# Patient Record
Sex: Female | Born: 1970 | Race: Black or African American | Hispanic: Yes | Marital: Single | Smoking: Never smoker
Health system: Southern US, Community
[De-identification: ages and names within clinical notes are randomized; demographics above are authoritative.]

## PROBLEM LIST (undated history)

## (undated) DIAGNOSIS — R51 Headache: Secondary | ICD-10-CM

## (undated) DIAGNOSIS — M1990 Unspecified osteoarthritis, unspecified site: Secondary | ICD-10-CM

## (undated) DIAGNOSIS — M25561 Pain in right knee: Secondary | ICD-10-CM

## (undated) DIAGNOSIS — F419 Anxiety disorder, unspecified: Secondary | ICD-10-CM

## (undated) DIAGNOSIS — R760 Raised antibody titer: Secondary | ICD-10-CM

## (undated) DIAGNOSIS — R519 Headache, unspecified: Secondary | ICD-10-CM

## (undated) DIAGNOSIS — D649 Anemia, unspecified: Secondary | ICD-10-CM

## (undated) DIAGNOSIS — I10 Essential (primary) hypertension: Secondary | ICD-10-CM

## (undated) DIAGNOSIS — M25562 Pain in left knee: Secondary | ICD-10-CM

## (undated) HISTORY — PX: MYOMECTOMY: SHX85

## (undated) HISTORY — PX: REDUCTION MAMMAPLASTY: SUR839

## (undated) HISTORY — PX: TUMOR REMOVAL: SHX12

## (undated) HISTORY — PX: WISDOM TOOTH EXTRACTION: SHX21

---

## 1999-04-09 ENCOUNTER — Other Ambulatory Visit: Admission: RE | Admit: 1999-04-09 | Discharge: 1999-04-09 | Payer: Self-pay | Admitting: Family Medicine

## 2000-09-25 ENCOUNTER — Encounter: Payer: Self-pay | Admitting: Family Medicine

## 2000-09-25 ENCOUNTER — Encounter: Admission: RE | Admit: 2000-09-25 | Discharge: 2000-09-25 | Payer: Self-pay | Admitting: Family Medicine

## 2000-11-25 ENCOUNTER — Encounter: Admission: RE | Admit: 2000-11-25 | Discharge: 2001-02-23 | Payer: Self-pay | Admitting: Obstetrics and Gynecology

## 2001-09-17 ENCOUNTER — Encounter: Payer: Self-pay | Admitting: Family Medicine

## 2001-09-17 ENCOUNTER — Encounter: Admission: RE | Admit: 2001-09-17 | Discharge: 2001-09-17 | Payer: Self-pay | Admitting: Family Medicine

## 2002-05-11 ENCOUNTER — Encounter (INDEPENDENT_AMBULATORY_CARE_PROVIDER_SITE_OTHER): Payer: Self-pay | Admitting: Specialist

## 2002-05-11 ENCOUNTER — Inpatient Hospital Stay (HOSPITAL_COMMUNITY): Admission: RE | Admit: 2002-05-11 | Discharge: 2002-05-15 | Payer: Self-pay | Admitting: Obstetrics and Gynecology

## 2002-05-13 ENCOUNTER — Encounter: Payer: Self-pay | Admitting: Obstetrics and Gynecology

## 2002-05-14 ENCOUNTER — Encounter: Payer: Self-pay | Admitting: Obstetrics and Gynecology

## 2002-10-13 ENCOUNTER — Other Ambulatory Visit: Admission: RE | Admit: 2002-10-13 | Discharge: 2002-10-13 | Payer: Self-pay | Admitting: Obstetrics and Gynecology

## 2003-03-15 ENCOUNTER — Ambulatory Visit (HOSPITAL_COMMUNITY): Admission: RE | Admit: 2003-03-15 | Discharge: 2003-03-15 | Payer: Self-pay | Admitting: Obstetrics and Gynecology

## 2003-10-19 ENCOUNTER — Other Ambulatory Visit: Admission: RE | Admit: 2003-10-19 | Discharge: 2003-10-19 | Payer: Self-pay | Admitting: Obstetrics and Gynecology

## 2004-11-28 ENCOUNTER — Other Ambulatory Visit: Admission: RE | Admit: 2004-11-28 | Discharge: 2004-11-28 | Payer: Self-pay | Admitting: Obstetrics and Gynecology

## 2004-12-07 ENCOUNTER — Ambulatory Visit: Payer: Self-pay | Admitting: Oncology

## 2006-12-01 ENCOUNTER — Encounter: Admission: RE | Admit: 2006-12-01 | Discharge: 2006-12-01 | Payer: Self-pay | Admitting: Otolaryngology

## 2010-09-07 NOTE — H&P (Signed)
NAME:  Claire Woods, Claire Woods                        ACCOUNT NO.:  [id number]   MEDICAL RECORD NO.:  [id number]                   PATIENT TYPE:  INP   LOCATION:  NA                                   FACILITY:  [facility]   PHYSICIAN:  Dois Davenport A. Rivard, M.D.              DATE OF BIRTH:  05-10-1970   DATE OF ADMISSION:  05/11/2002  DATE OF DISCHARGE:                                HISTORY & PHYSICAL   REASON FOR ADMISSION:  Uterine fibroids.   HISTORY OF PRESENT ILLNESS:  This is a 40 year old single African-American  woman, gravida 1, para 0, AB 1; who has been followed by our office since  July 2002 for symptomatic uterine fibroids, which have been know to the  patient since 1993.  She has used efficiently birth control pills from 1993  to 1997, which did control her symptoms.  Her main concern at this point is  the size of her uterine fibroids, which is now very uncomfortable.   An ultrasound in May 2003 revealed an enlarged uterus, measuring 2.6 x 11.2  x 20 cm, with the largest fibroid reaching the left lobe of her liver and  measuring 14.7 x 11.4 x 14.4 cm.  At that time, in June 2003, she had  stopped using Alesse and she was reporting a regular menstrual cycle every  28 days, lasting for five to six days with 48-72 hrs of very heavy flow,  where she required the use of one tampon and one pad every 2-3 hours.  Her  dysmenorrhea lasted two to three days with back pain, worse at night; for  which she uses ibuprofen 600 mg once or twice a day, with no significant  improvement when lying down.   She has now received Depo-Lupron 11.25 mg on October 28, 2001, 3.75 mg on  October 6, November 4, March 25 2002 and April 26, 2002.  Her last  ultrasound in the office was April 12, 2002, which revealed a uterine  size of 17 cm x 10.3 cm x 17.3 cm with a large fundal fibroid measuring  11.4 x 8.4 x 9.5 cm.  A second fibroid in the anterior middle part of the  uterus measured  5.5 x 5.7 x 5.5 cm.   The ovaries were not well seen on that ultrasound, but  no mass was seen in the adnexal area.  Previous ultrasound had seen right  ovary, which was normal; left ovary was never seen, due to the shadowing of  the uterine fibroid.  She had been amenorrheic since August 2003, and she  now desires surgery to maintain her fertility with myomectomy.   An MRI of the pelvis dated October 12, 2001 revealed absence of hydronephrosis,  markedly enlarged uterus, overall size 15 x 10 x 20; with a large fundal  fibroid measuring 10 x 15 x 13, multiple smaller fibroids are seen in the  body and lower uterine  segments and are ranging from 3-6 cm.  Normal  endometrium.   REVIEW OF SYSTEMS:  CONSTITUTIONAL:  Negative.  HEENT:  Normal.  HEART:  Normal.  RESPIRATORY:  Normal.  GENITOURINARY:  Other than previously  mentioned, normal.  GASTROINTESTINAL:  Normal.  NEUROLOGICAL:  Normal.   PAST MEDICAL HISTORY:  Hypertension, currently using Accuretic 20/25 mg q.d.   PAST SURGICAL HISTORY:  No previous surgeries.   ALLERGIES:  NO KNOWN DRUG ALLERGIES.   SOCIAL HISTORY:  Single, nonsmoker.  She is a Psychologist, forensic.   FAMILY HISTORY:  No history of feminine or colon cancer.  Mother is 49-year-  old and has hypertension.  Father is  4 years old and has hypertension.  Two sisters, one 71 year old with  possible Lupus and a  38 year old alive and well; one brother, 43 years old, who is alive and  well.   PHYSICAL EXAMINATION:  VITAL SIGNS:  Current weight 243 pounds.  Height 5  feet 7 inches.  Blood pressure 130/90.  HEENT:  Normal.  NECK:  Thyroid normal.  HEART:  Normal.  CHEST:  Clear.  BREASTS:  Normal.  BACK:  Normal; no CVA tenderness.  ABDOMEN:  No tenderness or hepatosplenomegaly; uterine fibroid is felt  feeling the pelvic abdominal cavity.  EXTREMITIES:  Normal.  NEUROLOGIC:  Normal.  GYN EXAM:  Reveals a normal external genitalia.  Vagina is normal.  Cervix  is normal.  Uterus is  enlarged, again rising to the upper left quadrant of  the abdomen.   ASSESSMENT:  Large uterine fibroids in a patient desiring preservation of  fertility.   PLAN:  The patient will undergo myomectomy with laparotomy.   The procedure has been reviewed in depth with the patient, including  possible complications such as bleeding requiring transfusion, infection;  injury to bowels, bladder or ureters, need for hysterectomy, possibility of  tubal occlusion after the procedure.  Informed consent was obtained.                                               Crist Fat Rivard, M.D.    SAR/MEDQ  D:  05/10/2002  T:  05/10/2002  Job:  [id number]

## 2010-09-07 NOTE — Discharge Summary (Signed)
NAME:  Claire Woods, Claire Woods                        ACCOUNT NO.:  [id number]   MEDICAL RECORD NO.:  [id number]                   PATIENT TYPE:  INP   LOCATION:  9304                                 FACILITY:  [facility]   PHYSICIAN:  Crist Fat. Rivard, M.D.              DATE OF BIRTH:  12/25/1970   DATE OF ADMISSION:  05/11/2002  DATE OF DISCHARGE:  05/15/2002                                 DISCHARGE SUMMARY   DISCHARGE DIAGNOSES:  1. Large uterine fibroids.  2. Desire to preserve fertility.  3. Severe anemia.   OPERATION AND PROCEDURES:  On the date of admission the patient underwent an  abdominal myomectomy with placement of a JP drain, tolerating the procedure  well.  The patient was found to have multiple fibroids on her uterus for a  total of 13, with the largest measuring 16 cm x 10 cm.  The total weight of  all of the fibroids was 1326 grams.  The patient had normal-appearing tubes  and ovaries bilaterally.  The patient received a blood transfusion of 2  units of packed red blood cells on May 13, 2002.  She had a chest x-ray  which revealed low lung volumes and mild bilateral lower lung atelectasis on  May 13, 2002.  She also had a pelvic ultrasound on May 14, 2002  which was technically limited due to enlarged uterus but showed a complex  fluid collection in the left adnexa with extension to the cul-de-sac  measuring approximately 5.9 x 2.0 x 4.5 cm.   HISTORY OF PRESENT ILLNESS:  The patient is a 40 year old single African-  American female para 1-0-1-1 who underwent an abdominal myomectomy because  of symptomatic uterine fibroids.  Please see the patient's dictated History  and Physical Examination for details.   PHYSICAL EXAMINATION:  VITAL SIGNS:  Weight 243 pounds, height 5 feet 7  inches tall, blood pressure 130/90.  GENERAL:  Within normal limits; however, note that abdomen is nontender  without hepatosplenomegaly though there is a uterine fibroid felt arising  from the pelvic abdominal cavity to the level of the patient's liver edge.  GYNECOLOGICAL:  Normal external genitalia.  Vagina is normal.  Cervix is  normal.  Uterus is enlarged, again, to the upper quadrant of the abdomen.   HOSPITAL COURSE:  On the date of admission the patient underwent  aforementioned procedure, tolerating it well.  Postoperative course was  marked by severe anemia - immediately postoperative 5.9 hemoglobin compared  with a preoperative hemoglobin of 12.  The patient also spiked a temperature  as high as 102.8 for which she was placed on clindamycin, Unasyn, and  ampicillin.  Due to severe symptoms related to her anemia, the patient  consented on postoperative day #1 to receive 2 units of packed red blood  cells which brought her hemoglobin to a level of 6.7.  By postoperative day  #3 the patient had resumed bowel  and bladder function and had shown a  resolution of her febrile state.  By postoperative day #4 she had achieved  the maximum benefit of her hospital stay and therefore was deemed ready for  discharge home.   DISCHARGE MEDICATIONS:  1. Iron 325 mg one tablet twice daily for six weeks.  2. Phenergan 12.5 mg one tablet q.6h. as needed for nausea.  3. Ibuprofen 600 mg one tablet with food q.6h. for five days then as needed     for pain.  4. Tylox one to two tablets q.4-6h. as needed for pain.  5. Colace one tablet twice daily until bowel movements are regular.  6. The patient was also given antibiotics by Dr. Dois Davenport Rivard, Augmentin     and clindamycin, to take for 10 days.   FOLLOW-UP:  The patient is scheduled for a staple removal at [facility] on May 20, 2002 at 11:45 a.m.  She also is scheduled for  a two weeks follow-up with Dr. Estanislado Pandy on May 28, 2002 at 9 a.m.  The  patient has a six weeks postoperative visit with  Dr. Estanislado Pandy on June 25, 2002 at 3 p.m.   DISCHARGE INSTRUCTIONS:  1. The patient was given a copy of [facility] postoperative     instruction sheet.  2. She was further advised to avoid driving for two weeks, heavy lifting for     four weeks, and intercourse for six weeks.  3. The patient's diet is without restriction.   FINAL PATHOLOGY:  Uterus, myomectomy:  Leiomyomata.     [place] J. Adline Peals.                    Crist Fat Rivard, M.D.    EJP/MEDQ  D:  06/17/2002  T:  06/18/2002  Job:  [id number]

## 2010-09-07 NOTE — Op Note (Signed)
NAME:  Claire Woods, Claire Woods                        ACCOUNT NO.:  [id number]   MEDICAL RECORD NO.:  [id number]                   PATIENT TYPE:  INP   LOCATION:  9304                                 FACILITY:  [facility]   PHYSICIAN:  Crist Fat. Rivard, M.D.              DATE OF BIRTH:  [birth year]   DATE OF PROCEDURE:  05/11/2002  DATE OF DISCHARGE:                                 OPERATIVE REPORT   PREOPERATIVE DIAGNOSIS:  Uterine fibroids.   POSTOPERATIVE DIAGNOSIS:  Uterine fibroids.   PROCEDURE:  Myomectomy.   SURGEON:  Crist Fat. Rivard, M.D.   ASSISTANTMarquis Lunch. Adline Peals.   ANESTHESIA:  General.   ESTIMATED BLOOD LOSS:  1000 cubic centimeters.   DESCRIPTION OF PROCEDURE:  After being informed of the planned procedure  with possible complications, including bleeding, need for transfusion,  infection, injury to other organs, risk of requiring hysterectomy, risk of  postoperative tubal occlusion, informed consent was obtained.  The patient  was taken to OR #3, given general anesthesia with endotracheal intubation  without complication.   The patient was placed in a dorsal decubitus position, prepped and draped in  a sterile fashion, and a Foley catheter was inserted in her bladder.  After  flexing her hips and knees, we inserted a speculum in order to place an  intrauterine balloon catheter for chromopertubation during the procedure.  Due to the size of the fibroids, the cervical os was impossible to  cannulate.  We then proceeded with a midline incision from symphysis pubis  to umbilical area using knife down to the fascia.  The fascia is incised in  a midline fashion, linea alba is identified, and peritoneum is entered in a  blunt fashion.   Observation:  The uterus is greatly enlarged with a predominant fundal  fibroid measuring approximately 16 cm in size.  We then see in the right  cornual area two intramural fibroids measuring 4 cm, three subserosal  fibroids measuring  1 cm.  In the left cornual posterior area, two fibroids  measuring 4-5 cm, intramural, three fibroids measuring 1-2 cm, intramural.  Both tubes are visualized and normal.  Both ovaries are normal.  The right  tube is displaced anteriorly by one of the previously-mentioned intramural  fibroids.  We are able to exteriorize the uterus en bloc, and the broad  ligament is opened on each side of the lower uterine segment in an avascular  space using cautery.  These spaces allow [place] to place three tourniquets using  5 French feeding tubes encircling the lower uterine segment and both  infundibulopelvic ligaments in order to decrease the pulse pressure.  These  tourniquets are kept in place using Kelly clamps.  We then proceed with  systematic removal of all fibroids, starting with the large fundal fibroid  with a midline fundal incision after infiltration with vasopressin.  The  fibroid is enucleated and removed entirely.  Through the same incision we  are able to remove most of the previously-mentioned fibroids.  The  myometrium is then closed in multiple layers using 0 Vicryl sutures.  The  serosa is then closed using a baseball running suture of 2-0 Vicryl.  The  incision is closed transversely from one cornu to the other, taking care in  avoiding the tubal ostia.  We then remove through a posterior wall midline  incision five of the previously-mentioned fibroids, again after infiltration  with vasopressin.  Again the myometrium is closed in multiple layers until  complete reapproximation using simple sutures of 0 Vicryl and closing the  serosa with a running baseball suture of 2-0 Vicryl.  We are able to remove  all the fibroids noted in the myometrium.  At no time is there any entrance  in the endometrial cavity.  Some concern exists in regard to the right tube  due to its anatomical position in regard to the fibroids removed.  The left  tube was never affected by the removal of the other  fibroids.  We then  evaluate hemostasis, which is felt to be adequate.  We irrigate the pelvis  with warm saline.  We remove all tourniquets, return the uterus in the  pelvic cavity, and cover both incisions with one sheet of Interceed.  Under-  fascia hemostasis is completed with cautery, and the fascia is closed with  two running sutures of 0 Vicryl  meeting midline.  Subcutaneous tissue and  fat were irrigated with warm saline, and their hemostasis was completed with  cautery.  Due to the thickness of the panniculus, a Jackson-Pratt drain was  left in the incision and exteriorized via a contra-incision in the left  lower quadrant and sutured in place with 0 nylon.  Skin was closed with  staples.  Instrument and sponge count were complete x2.  Estimated blood  loss is at 1000 cubic centimeters.  The patient received 5000 cubic  centimeters of crystalloids and had a urinary output of 750 cubic  centimeters over the three-hour duration of the procedure.  The procedure  was very well-tolerated by the patient, who was taken to the recovery room  in the well and stable condition.  Please note that 13 fibroids were removed  altogether, for a total weight of 1300 g, or 2 pounds 14 ounces.                                                Crist Fat Rivard, M.D.    SAR/MEDQ  D:  05/11/2002  T:  05/12/2002  Job:  [id number]

## 2011-03-26 ENCOUNTER — Other Ambulatory Visit: Payer: Self-pay | Admitting: Obstetrics and Gynecology

## 2011-03-26 ENCOUNTER — Other Ambulatory Visit (HOSPITAL_COMMUNITY)
Admission: RE | Admit: 2011-03-26 | Discharge: 2011-03-26 | Disposition: A | Discharge status: Home / Self Care | Payer: BC Managed Care – PPO | Source: Ambulatory Visit | Attending: Obstetrics and Gynecology | Admitting: Obstetrics and Gynecology

## 2011-03-26 DIAGNOSIS — Z01419 Encounter for gynecological examination (general) (routine) without abnormal findings: Secondary | ICD-10-CM | POA: Insufficient documentation

## 2013-02-12 ENCOUNTER — Other Ambulatory Visit (HOSPITAL_COMMUNITY)
Admission: RE | Admit: 2013-02-12 | Discharge: 2013-02-12 | Disposition: A | Discharge status: Home / Self Care | Payer: BC Managed Care – PPO | Source: Ambulatory Visit | Attending: Obstetrics and Gynecology | Admitting: Obstetrics and Gynecology

## 2013-02-12 ENCOUNTER — Other Ambulatory Visit: Payer: Self-pay | Admitting: Obstetrics and Gynecology

## 2013-02-12 DIAGNOSIS — Z01419 Encounter for gynecological examination (general) (routine) without abnormal findings: Secondary | ICD-10-CM | POA: Insufficient documentation

## 2013-02-12 DIAGNOSIS — Z1151 Encounter for screening for human papillomavirus (HPV): Secondary | ICD-10-CM | POA: Insufficient documentation

## 2013-03-10 ENCOUNTER — Other Ambulatory Visit: Payer: Self-pay

## 2013-03-10 DIAGNOSIS — Z1231 Encounter for screening mammogram for malignant neoplasm of breast: Secondary | ICD-10-CM

## 2013-04-07 ENCOUNTER — Ambulatory Visit: Payer: BC Managed Care – PPO

## 2014-07-22 ENCOUNTER — Other Ambulatory Visit: Payer: Self-pay

## 2014-07-22 DIAGNOSIS — Z1231 Encounter for screening mammogram for malignant neoplasm of breast: Secondary | ICD-10-CM

## 2014-08-01 ENCOUNTER — Ambulatory Visit
Admission: RE | Admit: 2014-08-01 | Discharge: 2014-08-01 | Disposition: A | Discharge status: Home / Self Care | Payer: BC Managed Care – PPO | Source: Ambulatory Visit

## 2014-08-01 DIAGNOSIS — Z1231 Encounter for screening mammogram for malignant neoplasm of breast: Secondary | ICD-10-CM

## 2014-08-22 ENCOUNTER — Other Ambulatory Visit: Payer: Self-pay | Admitting: Obstetrics and Gynecology

## 2014-08-22 ENCOUNTER — Other Ambulatory Visit (HOSPITAL_COMMUNITY)
Admission: RE | Admit: 2014-08-22 | Discharge: 2014-08-22 | Disposition: A | Discharge status: Home / Self Care | Payer: BC Managed Care – PPO | Source: Ambulatory Visit | Attending: Obstetrics and Gynecology | Admitting: Obstetrics and Gynecology

## 2014-08-22 DIAGNOSIS — Z01419 Encounter for gynecological examination (general) (routine) without abnormal findings: Secondary | ICD-10-CM | POA: Insufficient documentation

## 2014-08-23 LAB — CYTOLOGY - PAP

## 2014-10-07 ENCOUNTER — Ambulatory Visit: Payer: BC Managed Care – PPO | Admitting: Podiatry

## 2014-11-04 ENCOUNTER — Ambulatory Visit (INDEPENDENT_AMBULATORY_CARE_PROVIDER_SITE_OTHER): Payer: BC Managed Care – PPO

## 2014-11-04 ENCOUNTER — Ambulatory Visit (INDEPENDENT_AMBULATORY_CARE_PROVIDER_SITE_OTHER): Payer: BC Managed Care – PPO | Admitting: Podiatry

## 2014-11-04 DIAGNOSIS — M7662 Achilles tendinitis, left leg: Secondary | ICD-10-CM | POA: Diagnosis not present

## 2014-11-04 DIAGNOSIS — M79672 Pain in left foot: Secondary | ICD-10-CM

## 2014-11-04 MED ORDER — DICLOFENAC SODIUM 75 MG PO TBEC: 75.0000 mg | DELAYED_RELEASE_TABLET | Freq: Two times a day (BID) | ORAL | Status: DC

## 2014-11-04 NOTE — Progress Notes (Signed)
Subjective:     Patient ID: Claire Woods, female   DOB: Sep 05, 1970, 44 y.o.   MRN: [id number]  HPI patient presents stating my left Achilles tendon has been killing me and it's been going on for a month and I don't know what I did but it seems to be getting worse   Review of Systems  All other systems reviewed and are negative.      Objective:   Physical Exam  Constitutional: She is oriented to person, place, and time.  Cardiovascular: Intact distal pulses.   Musculoskeletal: Normal range of motion.  Neurological: She is oriented to person, place, and time.  Skin: Skin is warm.  Nursing note and vitals reviewed.  neurovascular status intact with muscle strength adequate range of motion within normal limits. Patient's noted to have some tightness of the Achilles tendon left but muscle strength was adequate of the tendon itself and does have quite a bit of inflammation on the medial side at the insertion to the calcaneus and more proximal at the musculotendinous junction. Patient has good digital perfusion and is well oriented 3     Assessment:     Acute Achilles tendinitis left with possible pre-rupture like environment    Plan:     H&P x-rays reviewed and educated patient on this problem. I have recommended immobilization due to where the problem is and I applied air fracture walker with instructions on usage along with ice therapy and oral diclofenac therapy. Patient will be reviewed evaluated in 4 weeks and may require physical therapy or MRI if symptoms were to persist

## 2014-11-04 NOTE — Progress Notes (Signed)
   Subjective:    Patient ID: Claire Woods, female    DOB: 16-Feb-1971, 44 y.o.   MRN: [id number]  HPI Pt presents with pain in left achilles tendon area, radiating medial and lateral, lasting 1 month now and worsening, unable to get pain relief. Pain is present when ambulating and when resting   Review of Systems  All other systems reviewed and are negative.      Objective:   Physical Exam        Assessment & Plan:

## 2014-11-04 NOTE — Patient Instructions (Signed)

## 2014-12-01 ENCOUNTER — Encounter: Payer: Self-pay | Admitting: Podiatry

## 2014-12-01 ENCOUNTER — Ambulatory Visit (INDEPENDENT_AMBULATORY_CARE_PROVIDER_SITE_OTHER): Payer: BC Managed Care – PPO | Admitting: Podiatry

## 2014-12-01 VITALS — BP 139/95 | HR 83 | Resp 16

## 2014-12-01 DIAGNOSIS — M7662 Achilles tendinitis, left leg: Secondary | ICD-10-CM

## 2014-12-01 MED ORDER — MELOXICAM 15 MG PO TABS
15.0000 mg | ORAL_TABLET | Freq: Every day | ORAL | Status: DC
Start: 2014-12-01 — End: 2015-03-02

## 2014-12-01 NOTE — Progress Notes (Signed)
Subjective:     Patient ID: Claire Woods, female   DOB: 24-Mar-1971, 44 y.o.   MRN: [id number]  HPI patient states it does feel a little bit better but still sore in the back of my left heel when I been on it a lot   Review of Systems     Objective:   Physical Exam neurovascular status intact   with continued discomfort in the Achilles tendon left in the posterior aspect at the musculotendinous junction. Insertion is doing well at this time   Assessment:     Achilles tendinitis improving but still present left    Plan:     Advised on continued immobilization gradual increase in activities and stretching heat and ice therapy and he'll lift. Reappoint to reevaluate again in 1 month

## 2015-01-05 ENCOUNTER — Ambulatory Visit: Payer: BC Managed Care – PPO | Admitting: Podiatry

## 2015-01-06 ENCOUNTER — Telehealth: Payer: Self-pay | Admitting: Podiatry

## 2015-01-06 NOTE — Telephone Encounter (Signed)
Left voicemail to call office to r/s appt.

## 2015-03-02 ENCOUNTER — Other Ambulatory Visit: Payer: Self-pay | Admitting: Podiatry

## 2015-03-02 NOTE — Telephone Encounter (Signed)
Pt was instructed to make a follow up appt, no future refills until evaluated.

## 2015-04-21 ENCOUNTER — Other Ambulatory Visit: Payer: Self-pay | Admitting: Podiatry

## 2015-05-27 ENCOUNTER — Other Ambulatory Visit: Payer: Self-pay | Admitting: Podiatry

## 2015-05-29 NOTE — Telephone Encounter (Signed)
Pt needs an appt prior to refills. 

## 2015-06-28 ENCOUNTER — Other Ambulatory Visit: Payer: Self-pay | Admitting: Podiatry

## 2015-07-05 ENCOUNTER — Ambulatory Visit: Payer: BC Managed Care – PPO | Admitting: Podiatry

## 2015-07-10 ENCOUNTER — Ambulatory Visit: Payer: BC Managed Care – PPO | Admitting: Podiatry

## 2015-07-12 ENCOUNTER — Encounter: Payer: Self-pay | Admitting: Podiatry

## 2015-07-12 ENCOUNTER — Ambulatory Visit (INDEPENDENT_AMBULATORY_CARE_PROVIDER_SITE_OTHER): Payer: BC Managed Care – PPO | Admitting: Podiatry

## 2015-07-12 VITALS — BP 151/96 | HR 95 | Resp 16

## 2015-07-12 DIAGNOSIS — M7662 Achilles tendinitis, left leg: Secondary | ICD-10-CM

## 2015-07-12 DIAGNOSIS — M79672 Pain in left foot: Secondary | ICD-10-CM

## 2015-07-12 MED ORDER — TRIAMCINOLONE ACETONIDE 10 MG/ML IJ SUSP
10.0000 mg | Freq: Once | INTRAMUSCULAR | Status: AC
  Administered 2015-07-12: 10 mg

## 2015-07-13 NOTE — Progress Notes (Signed)
Subjective:     Patient ID: Claire Woods, female   DOB: 1971-02-10, 45 y.o.   MRN: [id number]  HPI patient states I have this spot on the back of my heel that's become really sore. It's now where it inserts into the heel bone and it doesn't seem do with the tendon itself but it makes it hard to walk comfortably   Review of Systems     Objective:   Physical Exam Neurovascular status intact with discomfort at the insertion of the Achilles tendon into the posterior heel lateral side with inflammation noted with no central or medial tendon involvement    Assessment:     Achilles tendinitis left lateral side    Plan:     Instructed on physical therapy and went ahead and did careful injection lateral side 3 mg dexamethasone Kenalog 5 mg Xylocaine and advised on taking it easy and before doing the procedure I did discuss risk associated with this including chances for rupture. Patient understands risk and allow procedure to be performed

## 2015-07-20 ENCOUNTER — Other Ambulatory Visit: Payer: Self-pay | Admitting: Podiatry

## 2015-08-09 ENCOUNTER — Ambulatory Visit: Payer: BC Managed Care – PPO | Admitting: Podiatry

## 2015-08-26 ENCOUNTER — Other Ambulatory Visit: Payer: Self-pay | Admitting: Podiatry

## 2015-08-28 NOTE — Telephone Encounter (Signed)
Pt needs an appt prior to future refills. 

## 2015-11-27 ENCOUNTER — Ambulatory Visit (INDEPENDENT_AMBULATORY_CARE_PROVIDER_SITE_OTHER): Payer: BC Managed Care – PPO | Admitting: Podiatry

## 2015-11-27 ENCOUNTER — Encounter: Payer: Self-pay | Admitting: Podiatry

## 2015-11-27 ENCOUNTER — Ambulatory Visit (INDEPENDENT_AMBULATORY_CARE_PROVIDER_SITE_OTHER): Payer: BC Managed Care – PPO

## 2015-11-27 VITALS — BP 137/100 | HR 95 | Resp 16

## 2015-11-27 DIAGNOSIS — M25572 Pain in left ankle and joints of left foot: Secondary | ICD-10-CM

## 2015-11-27 DIAGNOSIS — M7662 Achilles tendinitis, left leg: Secondary | ICD-10-CM

## 2015-11-27 MED ORDER — TRIAMCINOLONE ACETONIDE 10 MG/ML IJ SUSP
10.0000 mg | Freq: Once | INTRAMUSCULAR | Status: AC
  Administered 2015-11-27: 10 mg

## 2015-11-28 NOTE — Progress Notes (Signed)
Subjective:     Patient ID: Claire Woods, female   DOB: November 16, 1970, 45 y.o.   MRN: [id number]  HPI patient states that her ankle did really well for around 6 months and is started to hurt her again and she wants to see if there's anything we can do to help her short-term   Review of Systems     Objective:   Physical Exam Neurovascular status intact with discomfort in the posterior lateral aspect of the left heel with inflammation with the center and medial doing well with good movement of the tendon and no indication of dysfunction    Assessment:     Achilles tendinitis left reoccurring    Plan:     Reviewed condition and advised on careful injection discussing the chances for rupture associated with it. Patient wants the procedure and today I went ahead did careful lateral injection 3 mg dexamethasone Kenalog 5 mg Xylocaine advised on ice therapy utilization of cast immobilization and will reduce activities. Reappoint to recheck again in 3 weeks

## 2015-12-14 ENCOUNTER — Other Ambulatory Visit: Payer: Self-pay | Admitting: Physician Assistant

## 2015-12-14 ENCOUNTER — Ambulatory Visit
Admission: RE | Admit: 2015-12-14 | Discharge: 2015-12-14 | Disposition: A | Discharge status: Home / Self Care | Payer: BC Managed Care – PPO | Source: Ambulatory Visit | Attending: Physician Assistant | Admitting: Physician Assistant

## 2015-12-14 DIAGNOSIS — R52 Pain, unspecified: Secondary | ICD-10-CM

## 2015-12-28 ENCOUNTER — Ambulatory Visit: Payer: BC Managed Care – PPO | Admitting: Podiatry

## 2016-01-04 ENCOUNTER — Other Ambulatory Visit: Payer: Self-pay | Admitting: Obstetrics and Gynecology

## 2016-01-04 ENCOUNTER — Other Ambulatory Visit (HOSPITAL_COMMUNITY)
Admission: RE | Admit: 2016-01-04 | Discharge: 2016-01-04 | Disposition: A | Discharge status: Home / Self Care | Payer: BC Managed Care – PPO | Source: Ambulatory Visit | Attending: Obstetrics and Gynecology | Admitting: Obstetrics and Gynecology

## 2016-01-04 DIAGNOSIS — Z01419 Encounter for gynecological examination (general) (routine) without abnormal findings: Secondary | ICD-10-CM | POA: Diagnosis not present

## 2016-01-05 LAB — CYTOLOGY - PAP

## 2016-01-23 ENCOUNTER — Encounter: Payer: Self-pay | Admitting: Oncology

## 2016-01-23 ENCOUNTER — Ambulatory Visit (INDEPENDENT_AMBULATORY_CARE_PROVIDER_SITE_OTHER): Payer: BC Managed Care – PPO | Admitting: Oncology

## 2016-01-23 DIAGNOSIS — Z8269 Family history of other diseases of the musculoskeletal system and connective tissue: Secondary | ICD-10-CM | POA: Diagnosis not present

## 2016-01-23 DIAGNOSIS — R760 Raised antibody titer: Secondary | ICD-10-CM

## 2016-01-23 NOTE — Consult Note (Signed)
New Patient Hematology   Claire Woods [id number] 1970-05-28 45 y.o. 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 She was first pregnant at age 89 and had a miscarriage in less than 4 weeks. She was pregnant again at age 58 and had a miscarriage prior to 8 weeks. She had surgery for a fibroid uterus about 10 years ago and had significant bleeding requiring transfusion. She has never had a thrombotic event. About one year ago she started to develop pain in her hand joints and subsequently her knees left greater than right. Joints are stiff in the morning. She has had problems with atypical rashes on her scalp, face, and back of the neck. She has had patchy alopecia. She gets intermittent headaches. She has cold intolerance and notices cyanotic changes of her fingertips in the cold. She was never told that she had a pleural or pericardial effusion. Thyroid functions have been normal.  She lost a sister at age 56 of complications of lupus and GI bleeding. A paternal aunt had lupus.  Laboratory evaluation done on 02/28/2015: ANA +1:320 speckled pattern. Anti-RNP antibodies elevated 3.0 normal less than 0.9, no elevation of Sjogren's, Smith, or anti-double-stranded DNA antibodies. C4 complement decreased 9 mg percent. Total complement greater than 60. C3 complement normal 1:15. C reactive protein 4.8 upper normal 4.9. IgM against beta-2 glycoprotein 1 148 units upper normal 32, against IgG 9 units (0-20). Lupus anticoagulant positive: PTT-L a 51.8 seconds, mix 49.6 seconds, DR VVT 80.5 seconds, mix 77.1, DR VVT confirm ratio 2.1 normal 0.8-1.2. Hexagonal phase phospholipid 35 seconds (0-11).   PMH: Hypertension. No history of MI. No ulcers. No asthma or lung problems. No thyroid disease. No history of  hepatitis, yellow jaundice, or mononucleosis. No kidney problems. Past surgery: Procedure for fibroid uterus 10 years ago. 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  Allergies: No Known Allergies  Medications:  Current Outpatient Prescriptions:  .  meloxicam (MOBIC) 15 MG tablet, TAKE 1 TABLET (15 MG TOTAL) BY MOUTH DAILY., Disp: 15 tablet, Rfl: 0 .  phentermine 37.5 MG capsule, Take 37.5 mg by mouth every morning., Disp: , Rfl:  .  quinapril-hydrochlorothiazide (ACCURETIC) 20-25 MG per tablet, Take 1 tablet by mouth daily., Disp: , Rfl: 6 .  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 She has a steady female companion but is not married. she has never smoked. She has never used smokeless tobacco. She reports that she drinks alcohol wine or hard liquor..  she does not use recreational drugs.  Family History: 1 sister died of complications of lupus at age 38. A sister 3 years younger a brother 2 years jogger and a brother 34 years younger are healthy. Parents both alive. Both with hypertension.   Review of Systems: See HPI Remaining ROS negative.  Physical Exam: Blood pressure (!) 147/81, pulse 92, temperature 98.4 F (36.9 C), temperature source Oral, height 5\' 7"  (1.702 m), weight 262 lb 1.6 oz (118.9 kg), SpO2 100 %. Wt Readings from Last 3 Encounters:  01/23/16 262 lb 1.6 oz (118.9 kg)     General appearance: obese African American woman HENNT: Pharynx no erythema, exudate, mass, or ulcer. No thyromegaly or  thyroid nodules Lymph nodes: No cervical, supraclavicular, or axillary lymphadenopathy Breasts:  Lungs: Clear to auscultation, resonant to percussion throughout Heart: Regular rhythm, no murmur, no gallop, no rub, no click, no edema Abdomen: Soft, nontender, normal bowel sounds, no mass, no organomegaly Extremities: No edema, no calf  tenderness Musculoskeletal: no joint deformities GU: Vascular: Carotid pulses 2+, no bruits, distal pulses: Dorsalis pedis 1+ symmetric Neurologic: Alert, oriented, PERRLA, optic discs sharp and vessels normal, no hemorrhage or exudate, cranial nerves grossly normal, motor strength 5 over 5, reflexes 1+ symmetric at biceps, absent symmetric at knees, upper body coordination normal, gait normal, Skin: No rash or ecchymosis but wearing heavy make-up to cover facial rash    Lab Results: No results found for: WBC, HGB, HCT, MCV, PLT   Chemistry   No results found for: NA, K, CL, CO2, BUN, CREATININE, GLU No results found for: CALCIUM, ALKPHOS, AST, ALT, BILITOT     Impression: Positive antiphospholipid antibodies:  No history of thrombosis but 2 first trimester pregnancy losses. Technically she does not meet criteria for antiphospholipid antibody syndrome which specified at least 3 first trimester losses or 1 late-term loss. She clearly has signs and symptoms of a developing collagen vascular disorder with a high positive ANA and positive anti-RNP antibodies.  We discussed the pathogenesis of antiphospholipid antibodies and their clinical importance with respect to predisposition towards clotting and pregnancy loss. There is no specific prophylactic therapy outside of pregnancy but I don't think it would be unreasonable for someone like her to be on one aspirin a day. Since she is taking a chronic nonsteroidal on a regular basis, this would substitute for the antiplatelet effect of aspirin. She is advised of the increased risk of clotting around any surgical procedures. She should receive at minimum the standard prophylactic dose anticoagulants peri-operatively but should not require therapeutic doses.  I'm going to repeat an antiphospholipid antibody panel and a lupus anticoagulant panel as well as a CBC & PTT today to demonstrate that they are reproducibly abnormal. I considered checking  cryoglobulins in view of her complaints of cyanosis of her fingertips in the cold, but I forgot to put it on the order sheet.  I did not schedule a formal follow-up visit but told to call if she does have surgery planned so that I am available for advice on anticoagulation if needed.      Murriel Hopper, MD, [facility]  Hematology-Oncology/Internal Medicine  01/23/2016, 5:13 PM

## 2016-01-23 NOTE — Patient Instructions (Signed)
To lab today Return as needed Call for advice on blood thinners if you have surgery planned

## 2016-01-23 NOTE — Progress Notes (Signed)
Full outpatient consult note dictated and in chart.

## 2016-01-25 LAB — LUPUS ANTICOAGULANT PANEL
DRVVT: 59.8 s — AB (ref 0.0–47.0)
PTT LA: 47.5 s (ref 0.0–51.9)

## 2016-01-25 LAB — CARDIOLIPIN ANTIBODIES, IGG, IGM, IGA
ANTICARDIOLIPIN IGG: 22 GPL U/mL — AB (ref 0–14)
Anticardiolipin IgA: <9 APL U/mL (ref 0–11)
Anticardiolipin IgM: 85 MPL U/mL — ABNORMAL HIGH (ref 0–12)

## 2016-01-25 LAB — CBC WITH DIFFERENTIAL/PLATELET
BASOS ABS: 0 10*3/uL (ref 0.0–0.2)
Basos: 0 %
EOS (ABSOLUTE): 0.1 10*3/uL (ref 0.0–0.4)
Eos: 3 %
Hematocrit: 35.6 % (ref 34.0–46.6)
Hemoglobin: 11.9 g/dL (ref 11.1–15.9)
Immature Grans (Abs): 0 10*3/uL (ref 0.0–0.1)
Immature Granulocytes: 0 %
LYMPHS ABS: 1.6 10*3/uL (ref 0.7–3.1)
Lymphs: 31 %
MCH: 25.5 pg — AB (ref 26.6–33.0)
MCHC: 33.4 g/dL (ref 31.5–35.7)
MCV: 76 fL — ABNORMAL LOW (ref 79–97)
MONOS ABS: 0.7 10*3/uL (ref 0.1–0.9)
Monocytes: 13 %
NEUTROS ABS: 2.8 10*3/uL (ref 1.4–7.0)
Neutrophils: 53 %
Platelets: 409 10*3/uL — ABNORMAL HIGH (ref 150–379)
RBC: 4.66 x10E6/uL (ref 3.77–5.28)
RDW: 17.8 % — AB (ref 12.3–15.4)
WBC: 5.3 10*3/uL (ref 3.4–10.8)

## 2016-01-25 LAB — APTT: APTT: 27 s (ref 24–33)

## 2016-01-25 LAB — DRVVT CONFIRM: DRVVT CONFIRM: 1.6 ratio — AB (ref 0.8–1.2)

## 2016-01-25 LAB — BETA-2-GLYCOPROTEIN I ABS, IGG/M/A
Beta-2 Glyco 1 IgA: <9 GPI IgA units (ref 0–25)
Beta-2 Glyco 1 IgM: 105 GPI IgM units — ABNORMAL HIGH (ref 0–32)
Beta-2 Glyco I IgG: <9 GPI IgG units (ref 0–20)

## 2016-01-25 LAB — DRVVT MIX: DRVVT MIX: 58.2 s — AB (ref 0.0–47.0)

## 2016-01-29 ENCOUNTER — Telehealth: Payer: Self-pay | Admitting: *Deleted

## 2016-01-29 NOTE — Telephone Encounter (Signed)
Pt called - no answer; left message "repeat lab confirms presence of antiphospholipid antibodies. No change in what we discussed at time of visit." per Dr Beryle Beams. And to call for any questions.

## 2016-01-29 NOTE — Telephone Encounter (Signed)
-----   Message from Annia Belt, MD sent at 01/26/2016 12:03 PM EDT ----- Call pt: repeat lab confirms presence of antiphospholipid antibodies. No change in what we discussed at time of visit.

## 2016-02-12 ENCOUNTER — Encounter: Payer: Self-pay | Admitting: Podiatry

## 2016-02-12 ENCOUNTER — Ambulatory Visit (INDEPENDENT_AMBULATORY_CARE_PROVIDER_SITE_OTHER): Payer: BC Managed Care – PPO | Admitting: Podiatry

## 2016-02-12 DIAGNOSIS — M779 Enthesopathy, unspecified: Secondary | ICD-10-CM | POA: Diagnosis not present

## 2016-02-12 MED ORDER — TRIAMCINOLONE ACETONIDE 10 MG/ML IJ SUSP
10.0000 mg | Freq: Once | INTRAMUSCULAR | Status: AC
  Administered 2016-02-12: 10 mg

## 2016-02-14 NOTE — Progress Notes (Signed)
Subjective:     Patient ID: Claire Woods, female   DOB: 10/20/1970, 45 y.o.   MRN: [id number]  HPI patient states that she's having pain but it seems to be in a different place and she knows that she also has a relatively flatfoot deformity   Review of Systems     Objective:   Physical Exam Neurovascular status intact muscle strength adequate range of motion within normal limits with patient found to have moderate flatfoot deformity left with inflammation noted around posterior tibial tendon left. Patient's posterior heel seems to be doing pretty well with minimal discomfort    Assessment:     Tendinitis posterior tibial left secondary probably to change in gait and flatfoot deformity along with posterior heel pain that's improving    Plan:     H&P conditions reviewed and careful sheath injection administered left 3 mg Kenalog 5 mg Xylocaine and advised on physical therapy anti-inflammatories and placed her in a fascial brace to lift the arch. I then went ahead and I scanned for custom orthotics to lift the arch at this time

## 2016-03-04 ENCOUNTER — Encounter: Payer: Self-pay | Admitting: Podiatry

## 2016-03-04 ENCOUNTER — Ambulatory Visit (INDEPENDENT_AMBULATORY_CARE_PROVIDER_SITE_OTHER): Payer: BC Managed Care – PPO | Admitting: Podiatry

## 2016-03-04 DIAGNOSIS — M779 Enthesopathy, unspecified: Secondary | ICD-10-CM

## 2016-03-04 NOTE — Patient Instructions (Signed)

## 2016-03-06 NOTE — Progress Notes (Signed)
Subjective:     Patient ID: Claire Woods, female   DOB: 02/21/1971, 45 y.o.   MRN: [id number]  HPI patient states that the foot still hurts some   Review of Systems     Objective:   Physical Exam Neurovascular status intact    Assessment:     Tendinitis    Plan:     Dispensed orthotics

## 2016-07-08 ENCOUNTER — Encounter: Payer: Self-pay | Admitting: Oncology

## 2016-07-08 ENCOUNTER — Telehealth: Payer: Self-pay | Admitting: *Deleted

## 2016-07-08 NOTE — Progress Notes (Signed)
Hematology: Phone conversation with Dr. Christophe Louis, OB/GYN, read patient Claire Woods. Patient is a 46 year old woman I saw for an office consultation in October 2017 for further evaluation subsequent to 2 first trimester pregnancy losses.  She tested positive for the presence of a lupus type anticoagulant and had high titers of anticardiolipin and anti-beta-2 glycoprotein 1 antibodies.  ANA positive.   She is scheduled for a hysterectomy on March 28. She had never had a thrombotic event so I believe prophylactic dose anticoagulation will be sufficient.. I recommend Lovenox 60 mg daily to start 12 hours postop and continue for 2 weeks.  She weighs 263 pounds.

## 2016-07-08 NOTE — Telephone Encounter (Signed)
I called her and advised.

## 2016-07-08 NOTE — Telephone Encounter (Signed)
Call/message from Dr Delila Pereyra OB/GYN - stated pt is having hysterectomy March 28; requestting anticoagulation recommendation. Telephone # [phone]  Message given to Dr Beryle Beams.

## 2016-07-22 ENCOUNTER — Other Ambulatory Visit: Payer: Self-pay | Admitting: Obstetrics and Gynecology

## 2016-07-28 ENCOUNTER — Other Ambulatory Visit: Payer: Self-pay | Admitting: Obstetrics and Gynecology

## 2016-07-28 NOTE — H&P (Signed)
Subjective: Chief Complaint(s):   PreOp hsitory and physical for 07/18/15 ( Uterine FIbroids)   HPI:  General 46 y/o presents for preoperative history and physical examination in preparationf or total abdominal hysterectomy and bilateral salpingectomy for management of uteirne fibroids.  On ultrasound her uterus measures 14 cm x 15 cm x 8 cm. She has multple uterine fibroid. The largest is 6.7 cm. she has pain and menorrhagia during her menses. She has h/o previous myomectomy performed by Dr. Cletis Media in 2004. 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 she had positive ANA and tested positive for lupus anticoagulant . she had high titers of anticardiolipin antibiodies. SHe was evaluated by Dr. Beryle Beams. He recommends Lovenox for anticoagulation 60 mg of Lovenox daily for 2 weeks post surgery. He recommends starting this 12-24 hours post surgery.  Current Medication:  Taking  Triamcinolone Acetonide 0.1 % Cream apply sparingly to affected area twice a day externally as needed     Quinapril-Hydrochlorothiazide 20/25 mg tablet 1 tablet Orally once a day     Naproxen 500 MG Tablet Delayed Release 1 tablet Orally Twice a day   Not-Taking  Nu-Iron(Polysaccharide Iron Complex) 150 MG Capsule 1 capsule Orally Once a day, Notes: sometimes     Phentermine HCl 37.5 MG Capsule 1 capsule Orally Once a day   Discontinued  Vimovo(Naproxen-Esomeprazole) 500-20 MG Tablet Delayed Release 1 tablet before meals Orally Twice a day, Notes: as needed     Medication List reviewed and reconciled with the patient   Medical History:   HTN     Pos Antiphospholipid antibodies/ Pos ANA - has seen [facility] for evaluation- no special tx needed (dx 2017)     GYN care per Dr. Landry Mellow      Allergies/Intolerance:   N.K.D.A.   Gyn History:   Sexual activity currently sexually active. Periods : every month. LMP 2 weeks ago. Birth control condoms. Last pap smear date 01/04/16, negative. Last mammogram date 08/01/14.  Abnormal pap smear yes, but not sure. H/O STD Chlamydia over 20 years ago . Menarche 54.   OB History:   Number of pregnancies 2. Pregnancy # 1 miscarriage. Pregnancy # 2 miscarriage.   Surgical History:   abdominal myomectomy 04/2002     left ear tumor 2008   Hospitalization:   None   Family History:   Father: alive, diagnosed with Hypertension    Mother: alive, diagnosed with Hypertension    Maternal Grand Mother: HTN    Sister 1: deceased, Lupus    denies family h/o gyn cancers.  Social History:  General Tobacco use cigarettes: Never smoked, Tobacco history last updated 07/08/2016.  no EXPOSURE TO PASSIVE SMOKE.  Alcohol: yes, 2 per week.  Caffeine: yes, 2-3 per week.  no Recreational drug use.  no Exercise.  Marital Status: single.  Children: none.  EDUCATION: yes.  OCCUPATION: employed, Lobbyist).  Seat belt use: yes.  ROS: CONSTITUTIONAL none" options="no,yes" propid="91" itemid="[id number]" categoryid="10464" encounterid="[id number]"Fatigue none. none today" options="no,yes" propid="91" itemid="10467" categoryid="10464" encounterid="[id number]"Fever none today.  CARDIOLOGY none" options="no,yes" propid="91" itemid="193603" categoryid="10488" encounterid="[id number]"Chest pain none.  RESPIRATORY no" options="no" propid="91" itemid="270013" categoryid="138132" encounterid="[id number]"Shortness of breath no. no" options="no,yes" propid="91" itemid="172745" categoryid="138132" encounterid="[id number]"Cough no.  GASTROENTEROLOGY none" options="no,yes" propid="91" itemid="193447" categoryid="10494" encounterid="[id number]"Appetite change none. no" options="no,yes" propid="91" itemid="193449" categoryid="10494" encounterid="[id number]"Change in bowel habits no.  FEMALE REPRODUCTIVE no" options="no,yes" propid="91" itemid="196298" categoryid="10525" encounterid="[id number]"Breast lumps or discharge no. none" options="no,yes" propid="91" itemid="186083" categoryid="10525"  encounterid="[id number]"Breast pain none. none" options="no,yes" propid="91" itemid="138198" categoryid="10525" encounterid="[id number]"Dyspareunia none. no" options="no,yes" propid="91" itemid="202654" categoryid="10525"  encounterid="[id number]"Dysuria no. none" options="no,yes" propid="91" itemid="186082" categoryid="10525" encounterid="[id number]"Pelvic pain none. yes" options="no,yes" propid="91" itemid="199173" categoryid="10525" encounterid="[id number]"Regular menses yes. no" options="no,yes" propid="91" itemid="278230" categoryid="10525" encounterid="[id number]"Unusual vaginal discharge no. no" options="no,yes" propid="91" itemid="278942" categoryid="10525" encounterid="[id number]"Vaginal itching no. no" options="no,yes" propid="91" itemid="278837" categoryid="10525" encounterid="[id number]"Vulvar/labial lesion no.  NEUROLOGY none" options="no,yes" propid="91" itemid="193627" categoryid="12512" encounterid="[id number]"Migraines none. none" options="no,yes" propid="91" itemid="12514" categoryid="12512" encounterid="[id number]"Tingling/numbness none. none" options="no,yes" propid="91" itemid="193467" categoryid="12512" encounterid="[id number]"Visual changes none.  PSYCHOLOGY no" options="" propid="91" itemid="275919" categoryid="10520" encounterid="[id number]"Depression no.  SKIN no" options="no,yes" propid="91" itemid="269383" categoryid="202750" encounterid="[id number]"Rash no. no" options="no,yes" propid="91" itemid="202757" categoryid="202750" encounterid="[id number]"Suspicious lesions no.  ENDOCRINOLOGY none" options="no,yes" propid="91" itemid="202624" categoryid="12508" encounterid="[id number]"Hot flashes none. no unintentional" options="no,yes" propid="91" itemid="193436" categoryid="12508" encounterid="[id number]"Weight gain no unintentional. none" options="no,yes" propid="91" itemid="138164" categoryid="12508" encounterid="[id number]"Weight loss none.  HEMATOLOGY/LYMPH no" options="no,yes" propid="91" itemid="193454" categoryid="138157"  encounterid="[id number]"Anemia no.    Objective: Vitals:  Wt 270, Wt change 7 lb, Ht 66.75, BMI 42.60, Pulse sitting 99, BP sitting 146/81  Past Results:  Examination:  Physical Examination: GENERAL in NAD, pleasant"Patient appears in NAD, pleasant. well developed"Build: well developed. overweight"General Appearance: overweight. african-american"Race: african-american.  LUNGS clear to auscultation"Breath sounds: clear to auscultation. no"Dyspnea: no.  HEART none"Murmurs: none. normal"Rate: normal. regular"Rhythm: regular.  ABDOMEN no masses,tenderness,organomegaly, no CVAT"General: no masses,tenderness,organomegaly, no CVAT.  FEMALE GENITOURINARY no mass, non tender"Adnexa: no mass, non tender. normal, no lesions"Anus/perineum: normal, no lesions. normal appearance , no lesions/discharge/bleeding, , good pelvic support , external os normal "Cervix/ cuff: normal appearance , no lesions/discharge/bleeding, , good pelvic support , external os normal . normal, no lesions, no skin discoloration, no lymphadenopathy"External genitalia: normal, no lesions, no skin discoloration, no lymphadenopathy. normal external meatus"Urethra: normal external meatus. normal size/shape/consistency, freely mobile, non tender"Uterus: normal size/shape/consistency, freely mobile, non tender. deferred"Rectum: deferred. pink/moist mucosa, no lesions, white discharge in vaginal vault "Vagina: pink/moist mucosa, no lesions, white discharge in vaginal vault . normal, no lesions, no skin discoloration, non tender"Vulva: normal, no lesions, no skin discoloration, non tender.  EXTREMITIES FROM of all extremities"Extremities FROM of all extremities.  NEUROLOGICAL normal"Gait: normal. alert and oriented x 3"Orientation: alert and oriented x 3.    Assessment: Assessment:  Fibroids, intramural - D25.1 (Primary)     Vaginal discharge - N89.8     Plan: Treatment:  Fibroids, intramural  Notes: patient desires definitive therapy  via hysterectomy given h/o myomectomy , size of uterus and nulliparous state. recommend total abdominal hysterectomy with bilateral salpingectomy. r/b/a of surgery discussed with Miss Devinney including but not limited to infection, bleeding , damage to bowel bladder and surrounding organs with the need for further surgery. r/o transfusion discussed r/o HIV/ Hep B&C . pt voiced understanding and desires to proceed with total abdominal hysterectomy with bilateral salpingectomy.  Referral To:  Reason: Vaginal discharge  Lab:[facility]  Procedures:  Immunizations:  Therapeutic Injections:  Diagnostic Imaging:  Lab Reports:  Lab:[facility]  WBCS -  Normal  Normal -   CLUE CELLS -  Few A None seen -   TRICHOMONAS -  None Seen  None Seen -   YEAST -  Present A None seen -     Avner Stroder 07/09/2016 10:05:47 AM > few yeast present however pt without symptoms ... no treatment warranted Haymore,Ariel 07/09/2016 04:54:53 PM > patient informed.

## 2016-07-29 ENCOUNTER — Other Ambulatory Visit (HOSPITAL_COMMUNITY): Payer: BC Managed Care – PPO

## 2016-07-29 ENCOUNTER — Encounter (HOSPITAL_COMMUNITY): Payer: Self-pay

## 2016-07-29 ENCOUNTER — Inpatient Hospital Stay (HOSPITAL_COMMUNITY)
Admission: RE | Admit: 2016-07-29 | Discharge: 2016-07-29 | Disposition: A | Discharge status: Home / Self Care | Payer: BC Managed Care – PPO | Source: Ambulatory Visit

## 2016-07-29 NOTE — Patient Instructions (Signed)
Your procedure is scheduled on:  Wednesday, July 31, 2016  Enter through the [employer] [facility] at:  7:00 AM  Pick up the phone at the desk and dial [phone].  Call this number if you have problems the morning of surgery: [phone].  Remember: Do NOT eat food or drink after:  Midnight Tuesday  Take these medicines the morning of surgery with a SIP OF WATER:  Quinapril  Stop ALL herbal medications at this time  Do NOT smoke the day of surgery.  Do NOT wear jewelry (body piercing), metal hair clips/bobby pins, make-up, or nail polish. Do NOT wear lotions, powders, or perfumes.  You may wear deodorant. Do NOT shave for 48 hours prior to surgery. Do NOT bring valuables to the hospital. Contacts, dentures, or bridgework may not be worn into surgery.  Leave suitcase in car.  After surgery it may be brought to your room.  For patients admitted to the hospital, checkout time is 11:00 AM the day of discharge.   Bring a copy of your healthcare power of attorney and living will documents.

## 2016-07-30 ENCOUNTER — Other Ambulatory Visit: Payer: Self-pay

## 2016-07-30 ENCOUNTER — Encounter (HOSPITAL_COMMUNITY): Payer: Self-pay

## 2016-07-30 ENCOUNTER — Encounter (HOSPITAL_COMMUNITY)
Admission: RE | Admit: 2016-07-30 | Discharge: 2016-07-30 | Disposition: A | Discharge status: Home / Self Care | Payer: BC Managed Care – PPO | Source: Ambulatory Visit | Attending: Obstetrics and Gynecology | Admitting: Obstetrics and Gynecology

## 2016-07-30 ENCOUNTER — Encounter (HOSPITAL_COMMUNITY): Payer: Self-pay | Admitting: Anesthesiology

## 2016-07-30 HISTORY — DX: Pain in right knee: M25.561

## 2016-07-30 HISTORY — DX: Pain in left knee: M25.562

## 2016-07-30 HISTORY — DX: Headache: R51

## 2016-07-30 HISTORY — DX: Anemia, unspecified: D64.9

## 2016-07-30 HISTORY — DX: Raised antibody titer: R76.0

## 2016-07-30 HISTORY — DX: Headache, unspecified: R51.9

## 2016-07-30 HISTORY — DX: Anxiety disorder, unspecified: F41.9

## 2016-07-30 HISTORY — DX: Unspecified osteoarthritis, unspecified site: M19.90

## 2016-07-30 HISTORY — DX: Essential (primary) hypertension: I10

## 2016-07-30 LAB — BASIC METABOLIC PANEL
Anion gap: 9 (ref 5–15)
BUN: 15 mg/dL (ref 6–20)
CALCIUM: 9 mg/dL (ref 8.9–10.3)
CO2: 24 mmol/L (ref 22–32)
CREATININE: 0.6 mg/dL (ref 0.44–1.00)
Chloride: 101 mmol/L (ref 101–111)
GFR calc non Af Amer: >60 mL/min (ref >60)
Glucose, Bld: 139 mg/dL — ABNORMAL HIGH (ref 65–99)
Potassium: 3.4 mmol/L — ABNORMAL LOW (ref 3.5–5.1)
SODIUM: 134 mmol/L — AB (ref 135–145)

## 2016-07-30 LAB — CBC
HCT: 34.8 % — ABNORMAL LOW (ref 36.0–46.0)
Hemoglobin: 11.1 g/dL — ABNORMAL LOW (ref 12.0–15.0)
MCH: 24.6 pg — AB (ref 26.0–34.0)
MCHC: 31.9 g/dL (ref 30.0–36.0)
MCV: 77 fL — AB (ref 78.0–100.0)
Platelets: 413 10*3/uL — ABNORMAL HIGH (ref 150–400)
RBC: 4.52 MIL/uL (ref 3.87–5.11)
RDW: 17.8 % — ABNORMAL HIGH (ref 11.5–15.5)
WBC: 7.2 10*3/uL (ref 4.0–10.5)

## 2016-07-30 LAB — TYPE AND SCREEN
ABO/RH(D): O POS
Antibody Screen: NEGATIVE

## 2016-07-30 LAB — ABO/RH: ABO/RH(D): O POS

## 2016-07-30 NOTE — Pre-Procedure Instructions (Signed)
Dr. Foster reviewed EKG no new orders received at this time. 

## 2016-07-30 NOTE — Anesthesia Preprocedure Evaluation (Addendum)
Anesthesia Evaluation  Patient identified by MRN, date of birth, ID band Patient awake    Reviewed: Allergy & Precautions, NPO status , Patient's Chart, lab work & pertinent test results  Airway Mallampati: III  TM Distance: >3 FB Neck ROM: Full    Dental no notable dental hx. (+) Teeth Intact   Pulmonary neg pulmonary ROS,    Pulmonary exam normal breath sounds clear to auscultation       Cardiovascular hypertension, Pt. on medications Normal cardiovascular exam Rhythm:Regular Rate:Normal     Neuro/Psych  Headaches, Anxiety    GI/Hepatic negative GI ROS, Neg liver ROS,   Endo/Other  Hyyperglycemia  Renal/GU negative Renal ROS  negative genitourinary   Musculoskeletal  (+) Arthritis , Osteoarthritis,    Abdominal (+) + obese,   Peds  Hematology  (+) anemia , Antiphospholipid Ab positive   Anesthesia Other Findings   Reproductive/Obstetrics Uterine fibroids                            Anesthesia Physical Anesthesia Plan  ASA: II  Anesthesia Plan: General   Post-op Pain Management:    Induction: Intravenous  Airway Management Planned: Oral ETT  Additional Equipment:   Intra-op Plan:   Post-operative Plan: Extubation in OR  Informed Consent: I have reviewed the patients History and Physical, chart, labs and discussed the procedure including the risks, benefits and alternatives for the proposed anesthesia with the patient or authorized representative who has indicated his/her understanding and acceptance.   Dental advisory given  Plan Discussed with: CRNA, Anesthesiologist and Surgeon  Anesthesia Plan Comments:        Anesthesia Quick Evaluation

## 2016-07-31 ENCOUNTER — Inpatient Hospital Stay (HOSPITAL_COMMUNITY): Payer: BC Managed Care – PPO | Admitting: Anesthesiology

## 2016-07-31 ENCOUNTER — Inpatient Hospital Stay (HOSPITAL_COMMUNITY)
Admission: RE | Admit: 2016-07-31 | Discharge: 2016-08-02 | DRG: 743 | Disposition: A | Discharge status: Home / Self Care | Payer: BC Managed Care – PPO | Source: Ambulatory Visit | Attending: Obstetrics and Gynecology | Admitting: Obstetrics and Gynecology

## 2016-07-31 ENCOUNTER — Encounter (HOSPITAL_COMMUNITY): Payer: Self-pay | Admitting: Certified Registered Nurse Anesthetist

## 2016-07-31 ENCOUNTER — Encounter (HOSPITAL_COMMUNITY)
Admission: RE | Disposition: A | Discharge status: Home / Self Care | Payer: Self-pay | Source: Ambulatory Visit | Attending: Obstetrics and Gynecology

## 2016-07-31 DIAGNOSIS — R102 Pelvic and perineal pain: Secondary | ICD-10-CM | POA: Diagnosis present

## 2016-07-31 DIAGNOSIS — N920 Excessive and frequent menstruation with regular cycle: Secondary | ICD-10-CM | POA: Diagnosis present

## 2016-07-31 DIAGNOSIS — I10 Essential (primary) hypertension: Secondary | ICD-10-CM | POA: Diagnosis present

## 2016-07-31 DIAGNOSIS — D259 Leiomyoma of uterus, unspecified: Secondary | ICD-10-CM | POA: Diagnosis present

## 2016-07-31 DIAGNOSIS — K660 Peritoneal adhesions (postprocedural) (postinfection): Secondary | ICD-10-CM | POA: Diagnosis present

## 2016-07-31 DIAGNOSIS — Z90710 Acquired absence of both cervix and uterus: Secondary | ICD-10-CM | POA: Diagnosis present

## 2016-07-31 DIAGNOSIS — D649 Anemia, unspecified: Secondary | ICD-10-CM | POA: Diagnosis present

## 2016-07-31 HISTORY — PX: HYSTERECTOMY ABDOMINAL WITH SALPINGECTOMY: SHX6725

## 2016-07-31 HISTORY — PX: LYSIS OF ADHESION: SHX5961

## 2016-07-31 LAB — CREATININE, SERUM: Creatinine, Ser: 0.63 mg/dL (ref 0.44–1.00)

## 2016-07-31 LAB — CBC
HCT: 31.5 % — ABNORMAL LOW (ref 36.0–46.0)
Hemoglobin: 10 g/dL — ABNORMAL LOW (ref 12.0–15.0)
MCH: 24.6 pg — ABNORMAL LOW (ref 26.0–34.0)
MCHC: 31.7 g/dL (ref 30.0–36.0)
MCV: 77.6 fL — ABNORMAL LOW (ref 78.0–100.0)
Platelets: 370 10*3/uL (ref 150–400)
RBC: 4.06 MIL/uL (ref 3.87–5.11)
RDW: 18 % — ABNORMAL HIGH (ref 11.5–15.5)
WBC: 17.6 10*3/uL — AB (ref 4.0–10.5)

## 2016-07-31 LAB — PREGNANCY, URINE: Preg Test, Ur: NEGATIVE

## 2016-07-31 SURGERY — HYSTERECTOMY, TOTAL, ABDOMINAL, WITH SALPINGECTOMY
Anesthesia: General | Site: Abdomen | Laterality: Bilateral

## 2016-07-31 MED ORDER — SUGAMMADEX SODIUM 200 MG/2ML IV SOLN
INTRAVENOUS | Status: DC | PRN
  Administered 2016-07-31: 200 mg via INTRAVENOUS

## 2016-07-31 MED ORDER — ONDANSETRON HCL 4 MG/2ML IJ SOLN
INTRAMUSCULAR | Status: DC | PRN
  Administered 2016-07-31: 4 mg via INTRAVENOUS

## 2016-07-31 MED ORDER — LACTATED RINGERS IV SOLN
INTRAVENOUS | Status: DC
  Administered 2016-07-31: 18:00:00 via INTRAVENOUS

## 2016-07-31 MED ORDER — ONDANSETRON HCL 4 MG PO TABS: 4.0000 mg | ORAL_TABLET | Freq: Four times a day (QID) | ORAL | Status: DC | PRN

## 2016-07-31 MED ORDER — HYDROMORPHONE HCL 1 MG/ML IJ SOLN
INTRAMUSCULAR | Status: AC
  Administered 2016-07-31: 0.5 mg via INTRAVENOUS
  Filled 2016-07-31: qty 1

## 2016-07-31 MED ORDER — HYDROMORPHONE 1 MG/ML IV SOLN
INTRAVENOUS | Status: DC
  Administered 2016-07-31: 15:00:00 via INTRAVENOUS
  Administered 2016-07-31: 1.8 mg via INTRAVENOUS
  Administered 2016-08-01: 1.2 mg via INTRAVENOUS
  Administered 2016-08-01: 5 mg via INTRAVENOUS
  Filled 2016-07-31: qty 25

## 2016-07-31 MED ORDER — OXYCODONE-ACETAMINOPHEN 5-325 MG PO TABS
1.0000 | ORAL_TABLET | ORAL | Status: DC | PRN
  Administered 2016-08-01 – 2016-08-02 (×3): 2 via ORAL
  Administered 2016-08-02: 1 via ORAL
  Filled 2016-07-31 (×2): qty 2
  Filled 2016-07-31 (×3): qty 1

## 2016-07-31 MED ORDER — IBUPROFEN 600 MG PO TABS
600.0000 mg | ORAL_TABLET | Freq: Four times a day (QID) | ORAL | Status: DC | PRN
  Administered 2016-08-02 (×2): 600 mg via ORAL
  Filled 2016-07-31 (×2): qty 1

## 2016-07-31 MED ORDER — FENTANYL CITRATE (PF) 250 MCG/5ML IJ SOLN
INTRAMUSCULAR | Status: AC
  Filled 2016-07-31: qty 5

## 2016-07-31 MED ORDER — DEXAMETHASONE SODIUM PHOSPHATE 4 MG/ML IJ SOLN
INTRAMUSCULAR | Status: AC
  Filled 2016-07-31: qty 1

## 2016-07-31 MED ORDER — LIDOCAINE HCL (CARDIAC) 20 MG/ML IV SOLN
INTRAVENOUS | Status: DC | PRN
  Administered 2016-07-31: 50 mg via INTRAVENOUS

## 2016-07-31 MED ORDER — ALBUMIN HUMAN 5 % IV SOLN
INTRAVENOUS | Status: DC | PRN
  Administered 2016-07-31: 12:00:00 via INTRAVENOUS

## 2016-07-31 MED ORDER — ONDANSETRON HCL 4 MG/2ML IJ SOLN: 4.0000 mg | Freq: Four times a day (QID) | INTRAMUSCULAR | Status: DC | PRN

## 2016-07-31 MED ORDER — KETOROLAC TROMETHAMINE 30 MG/ML IJ SOLN
30.0000 mg | Freq: Four times a day (QID) | INTRAMUSCULAR | Status: DC
  Administered 2016-07-31 – 2016-08-01 (×7): 30 mg via INTRAVENOUS
  Filled 2016-07-31 (×5): qty 1

## 2016-07-31 MED ORDER — QUINAPRIL-HYDROCHLOROTHIAZIDE 20-25 MG PO TABS: 1.0000 | ORAL_TABLET | Freq: Every day | ORAL | Status: DC

## 2016-07-31 MED ORDER — SCOPOLAMINE 1 MG/3DAYS TD PT72
MEDICATED_PATCH | TRANSDERMAL | Status: AC
  Administered 2016-07-31: 1.5 mg via TRANSDERMAL
  Filled 2016-07-31: qty 1

## 2016-07-31 MED ORDER — EPHEDRINE SULFATE 50 MG/ML IJ SOLN
INTRAMUSCULAR | Status: DC | PRN
  Administered 2016-07-31: 10 mg via INTRAVENOUS

## 2016-07-31 MED ORDER — HYDROMORPHONE HCL 1 MG/ML IJ SOLN
INTRAMUSCULAR | Status: AC
  Filled 2016-07-31: qty 1

## 2016-07-31 MED ORDER — HYDROMORPHONE HCL 1 MG/ML IJ SOLN
INTRAMUSCULAR | Status: DC | PRN
  Administered 2016-07-31 (×2): 0.5 mg via INTRAVENOUS

## 2016-07-31 MED ORDER — SODIUM CHLORIDE 0.9 % IR SOLN
Status: DC | PRN
  Administered 2016-07-31: 2000 mL

## 2016-07-31 MED ORDER — PHENYLEPHRINE 40 MCG/ML (10ML) SYRINGE FOR IV PUSH (FOR BLOOD PRESSURE SUPPORT)
PREFILLED_SYRINGE | INTRAVENOUS | Status: AC
  Filled 2016-07-31: qty 10

## 2016-07-31 MED ORDER — SODIUM CHLORIDE 0.9 % IJ SOLN
INTRAMUSCULAR | Status: AC
  Filled 2016-07-31: qty 100

## 2016-07-31 MED ORDER — VASOPRESSIN 20 UNIT/ML IV SOLN
INTRAVENOUS | Status: AC
  Filled 2016-07-31: qty 1

## 2016-07-31 MED ORDER — MIDAZOLAM HCL 2 MG/2ML IJ SOLN
INTRAMUSCULAR | Status: AC
  Filled 2016-07-31: qty 2

## 2016-07-31 MED ORDER — LIDOCAINE HCL (PF) 1 % IJ SOLN
INTRAMUSCULAR | Status: AC
  Filled 2016-07-31: qty 5

## 2016-07-31 MED ORDER — ROCURONIUM BROMIDE 100 MG/10ML IV SOLN
INTRAVENOUS | Status: DC | PRN
  Administered 2016-07-31: 5 mg via INTRAVENOUS
  Administered 2016-07-31: 10 mg via INTRAVENOUS
  Administered 2016-07-31: 50 mg via INTRAVENOUS
  Administered 2016-07-31 (×3): 10 mg via INTRAVENOUS

## 2016-07-31 MED ORDER — MENTHOL 3 MG MT LOZG: 1.0000 | LOZENGE | OROMUCOSAL | Status: DC | PRN

## 2016-07-31 MED ORDER — SIMETHICONE 80 MG PO CHEW
80.0000 mg | CHEWABLE_TABLET | Freq: Four times a day (QID) | ORAL | Status: DC | PRN
  Administered 2016-07-31: 80 mg via ORAL
  Filled 2016-07-31: qty 1

## 2016-07-31 MED ORDER — LACTATED RINGERS IV SOLN
INTRAVENOUS | Status: DC
  Administered 2016-07-31 (×2): via INTRAVENOUS
  Administered 2016-07-31: 125 mL/h via INTRAVENOUS

## 2016-07-31 MED ORDER — MIDAZOLAM HCL 5 MG/5ML IJ SOLN
INTRAMUSCULAR | Status: DC | PRN
  Administered 2016-07-31: 2 mg via INTRAVENOUS

## 2016-07-31 MED ORDER — MEPERIDINE HCL 25 MG/ML IJ SOLN: 6.2500 mg | INTRAMUSCULAR | Status: DC | PRN

## 2016-07-31 MED ORDER — VASOPRESSIN 20 UNIT/ML IV SOLN
INTRAVENOUS | Status: DC | PRN
  Administered 2016-07-31: 17 mL via INTRAMUSCULAR

## 2016-07-31 MED ORDER — KETOROLAC TROMETHAMINE 30 MG/ML IJ SOLN
INTRAMUSCULAR | Status: AC
  Filled 2016-07-31: qty 1

## 2016-07-31 MED ORDER — PANTOPRAZOLE SODIUM 40 MG IV SOLR
40.0000 mg | Freq: Every day | INTRAVENOUS | Status: DC
  Administered 2016-07-31 – 2016-08-01 (×2): 40 mg via INTRAVENOUS
  Filled 2016-07-31 (×4): qty 40

## 2016-07-31 MED ORDER — ONDANSETRON HCL 4 MG/2ML IJ SOLN
INTRAMUSCULAR | Status: AC
  Filled 2016-07-31: qty 2

## 2016-07-31 MED ORDER — FENTANYL CITRATE (PF) 100 MCG/2ML IJ SOLN
INTRAMUSCULAR | Status: DC | PRN
  Administered 2016-07-31: 50 ug via INTRAVENOUS
  Administered 2016-07-31 (×2): 25 ug via INTRAVENOUS
  Administered 2016-07-31 (×3): 50 ug via INTRAVENOUS

## 2016-07-31 MED ORDER — PROPOFOL 10 MG/ML IV BOLUS
INTRAVENOUS | Status: AC
  Filled 2016-07-31: qty 20

## 2016-07-31 MED ORDER — NALOXONE HCL 0.4 MG/ML IJ SOLN: 0.4000 mg | INTRAMUSCULAR | Status: DC | PRN

## 2016-07-31 MED ORDER — DIPHENHYDRAMINE HCL 12.5 MG/5ML PO ELIX: 12.5000 mg | ORAL_SOLUTION | Freq: Four times a day (QID) | ORAL | Status: DC | PRN

## 2016-07-31 MED ORDER — METOCLOPRAMIDE HCL 5 MG/ML IJ SOLN
10.0000 mg | Freq: Once | INTRAMUSCULAR | Status: DC | PRN
Start: 2016-07-31 — End: 2016-07-31

## 2016-07-31 MED ORDER — SUGAMMADEX SODIUM 200 MG/2ML IV SOLN
INTRAVENOUS | Status: AC
  Filled 2016-07-31: qty 2

## 2016-07-31 MED ORDER — SODIUM CHLORIDE 0.9% FLUSH: 9.0000 mL | INTRAVENOUS | Status: DC | PRN

## 2016-07-31 MED ORDER — KETOROLAC TROMETHAMINE 30 MG/ML IJ SOLN
30.0000 mg | Freq: Four times a day (QID) | INTRAMUSCULAR | Status: DC
  Filled 2016-07-31: qty 1

## 2016-07-31 MED ORDER — LISINOPRIL 20 MG PO TABS
20.0000 mg | ORAL_TABLET | Freq: Every day | ORAL | Status: DC
  Administered 2016-08-01 – 2016-08-02 (×2): 20 mg via ORAL
  Filled 2016-07-31 (×3): qty 1

## 2016-07-31 MED ORDER — HYDROCHLOROTHIAZIDE 25 MG PO TABS
25.0000 mg | ORAL_TABLET | Freq: Every day | ORAL | Status: DC
  Administered 2016-08-01 – 2016-08-02 (×2): 25 mg via ORAL
  Filled 2016-07-31 (×2): qty 1

## 2016-07-31 MED ORDER — PROPOFOL 10 MG/ML IV BOLUS
INTRAVENOUS | Status: DC | PRN
  Administered 2016-07-31: 200 mg via INTRAVENOUS

## 2016-07-31 MED ORDER — HYDROMORPHONE HCL 1 MG/ML IJ SOLN
0.2500 mg | INTRAMUSCULAR | Status: DC | PRN
  Administered 2016-07-31: 0.25 mg via INTRAVENOUS
  Administered 2016-07-31: 0.5 mg via INTRAVENOUS

## 2016-07-31 MED ORDER — SENNA 8.6 MG PO TABS
1.0000 | ORAL_TABLET | Freq: Two times a day (BID) | ORAL | Status: DC
  Administered 2016-07-31 – 2016-08-01 (×3): 8.6 mg via ORAL
  Filled 2016-07-31 (×8): qty 1

## 2016-07-31 MED ORDER — PHENYLEPHRINE HCL 10 MG/ML IJ SOLN
INTRAMUSCULAR | Status: DC | PRN
  Administered 2016-07-31: 80 ug via INTRAVENOUS
  Administered 2016-07-31 (×3): 40 ug via INTRAVENOUS

## 2016-07-31 MED ORDER — ENOXAPARIN SODIUM 60 MG/0.6ML ~~LOC~~ SOLN
60.0000 mg | SUBCUTANEOUS | Status: DC
  Administered 2016-08-01 – 2016-08-02 (×2): 60 mg via SUBCUTANEOUS
  Filled 2016-07-31 (×3): qty 0.6

## 2016-07-31 MED ORDER — SCOPOLAMINE 1 MG/3DAYS TD PT72
1.0000 | MEDICATED_PATCH | Freq: Once | TRANSDERMAL | Status: DC
  Administered 2016-07-31: 1.5 mg via TRANSDERMAL

## 2016-07-31 MED ORDER — DEXTROSE 5 % IV SOLN
3.0000 g | INTRAVENOUS | Status: AC
  Administered 2016-07-31: 3 g via INTRAVENOUS
  Filled 2016-07-31: qty 3000

## 2016-07-31 MED ORDER — DIPHENHYDRAMINE HCL 50 MG/ML IJ SOLN: 12.5000 mg | Freq: Four times a day (QID) | INTRAMUSCULAR | Status: DC | PRN

## 2016-07-31 MED ORDER — DEXAMETHASONE SODIUM PHOSPHATE 4 MG/ML IJ SOLN
INTRAMUSCULAR | Status: DC | PRN
  Administered 2016-07-31: 4 mg via INTRAVENOUS

## 2016-07-31 MED ORDER — KETOROLAC TROMETHAMINE 30 MG/ML IJ SOLN
INTRAMUSCULAR | Status: AC
  Administered 2016-07-31: 30 mg via INTRAVENOUS
  Filled 2016-07-31: qty 1

## 2016-07-31 SURGICAL SUPPLY — 45 items
APL SKNCLS STERI-STRIP NONHPOA (GAUZE/BANDAGES/DRESSINGS)
BENZOIN TINCTURE PRP APPL 2/3 (GAUZE/BANDAGES/DRESSINGS) IMPLANT
BINDER ABDOMINAL 12 ML 46-62 (SOFTGOODS) ×2 IMPLANT
CANISTER SUCT 3000ML PPV (MISCELLANEOUS) ×4 IMPLANT
CLOSURE WOUND 1/2 X4 (GAUZE/BANDAGES/DRESSINGS)
CLOTH BEACON ORANGE TIMEOUT ST (SAFETY) ×4 IMPLANT
CONT PATH 16OZ SNAP LID 3702 (MISCELLANEOUS) ×4 IMPLANT
DECANTER SPIKE VIAL GLASS SM (MISCELLANEOUS) IMPLANT
DRAPE WARM FLUID 44X44 (DRAPE) ×2 IMPLANT
DRSG OPSITE POSTOP 4X10 (GAUZE/BANDAGES/DRESSINGS) ×4 IMPLANT
DURAPREP 26ML APPLICATOR (WOUND CARE) ×4 IMPLANT
ELECT BLADE 6.5 EXT (BLADE) ×2 IMPLANT
GAUZE SPONGE 4X4 16PLY XRAY LF (GAUZE/BANDAGES/DRESSINGS) ×4 IMPLANT
GLOVE BIOGEL M 6.5 STRL (GLOVE) ×8 IMPLANT
GLOVE BIOGEL PI IND STRL 6.5 (GLOVE) ×2 IMPLANT
GLOVE BIOGEL PI IND STRL 7.0 (GLOVE) ×8 IMPLANT
GLOVE BIOGEL PI INDICATOR 6.5 (GLOVE) ×2
GLOVE BIOGEL PI INDICATOR 7.0 (GLOVE) ×8
GOWN STRL REUS W/TWL LRG LVL3 (GOWN DISPOSABLE) ×16 IMPLANT
HEMOSTAT ARISTA ABSORB 3G PWDR (MISCELLANEOUS) ×4 IMPLANT
NEEDLE HYPO 22GX1.5 SAFETY (NEEDLE) ×2 IMPLANT
NS IRRIG 1000ML POUR BTL (IV SOLUTION) ×7 IMPLANT
PACK ABDOMINAL GYN (CUSTOM PROCEDURE TRAY) ×4 IMPLANT
PAD OB MATERNITY 4.3X12.25 (PERSONAL CARE ITEMS) ×4 IMPLANT
PENCIL SMOKE EVAC W/HOLSTER (ELECTROSURGICAL) ×4 IMPLANT
PROTECTOR NERVE ULNAR (MISCELLANEOUS) ×6 IMPLANT
RETAINER VISCERAL (MISCELLANEOUS) ×2 IMPLANT
SPONGE LAP 18X18 X RAY DECT (DISPOSABLE) ×8 IMPLANT
STAPLER VISISTAT 35W (STAPLE) ×2 IMPLANT
STRIP CLOSURE SKIN 1/2X4 (GAUZE/BANDAGES/DRESSINGS) IMPLANT
SUT PDS AB 0 CT1 27 (SUTURE) ×8 IMPLANT
SUT PLAIN 2 0 XLH (SUTURE) ×6 IMPLANT
SUT VIC AB 0 CT1 27 (SUTURE) ×12
SUT VIC AB 0 CT1 27XCR 8 STRN (SUTURE) ×6 IMPLANT
SUT VIC AB 0 CT1 36 (SUTURE) ×4 IMPLANT
SUT VIC AB 2-0 CT1 (SUTURE) IMPLANT
SUT VIC AB 2-0 CT1 27 (SUTURE) ×4
SUT VIC AB 2-0 CT1 TAPERPNT 27 (SUTURE) ×2 IMPLANT
SUT VIC AB 2-0 SH 27 (SUTURE) ×4
SUT VIC AB 2-0 SH 27XBRD (SUTURE) ×2 IMPLANT
SUT VIC AB 4-0 KS 27 (SUTURE) IMPLANT
SUT VICRYL 0 TIES 12 18 (SUTURE) ×4 IMPLANT
SYR CONTROL 10ML LL (SYRINGE) ×2 IMPLANT
TOWEL OR 17X24 6PK STRL BLUE (TOWEL DISPOSABLE) ×8 IMPLANT
TRAY FOLEY CATH SILVER 14FR (SET/KITS/TRAYS/PACK) ×4 IMPLANT

## 2016-07-31 NOTE — Op Note (Signed)
07/31/2016  8:58 PM  PATIENT:  Claire Woods  46 y.o. female  PRE-OPERATIVE DIAGNOSIS:  D25.9 Fibroids, Anemia, Menorrhagia   POST-OPERATIVE DIAGNOSIS:  D25.9 Fibroids, Pelvic Adhesions disease   PROCEDURE:  Procedure(s): HYSTERECTOMY ABDOMINAL WITH SALPINGECTOMY (Bilateral) EXTENSIVE LYSIS OF ADHESIONS  SURGEON:  Surgeon(s) and Role:    * Christophe Louis, MD - Primary    * Janyth Pupa, DO - Assisting  PHYSICIAN ASSISTANT: None  ASSISTANTS: Dr. Janyth Pupa assisted due to the complexity of the surgery and concern for adhesive disease    ANESTHESIA:   general  EBL:  850 mL  BLOOD ADMINISTERED:none  DRAINS: Urinary Catheter (Foley)   LOCAL MEDICATIONS USED:  NONE  SPECIMEN:  Source of Specimen:  Uterus Cervix and bilateral Fallopian Tubes   DISPOSITION OF SPECIMEN:  PATHOLOGY  COUNTS:  YES  TOURNIQUET:  * No tourniquets in log *  DICTATION: .Dragon Dictation  PLAN OF CARE: Admit to inpatient   PATIENT DISPOSITION:  PACU - hemodynamically stable.   Delay start of Pharmacological VTE agent (>24 hrs) due to surgical blood loss or risk of bleeding: no    ASA Class: 2  Procedure Details  The patient was seen in the Holding Room. The risks, benefits, complications, treatment options, and expected outcomes were discussed with the patient.  The patient concurred with the proposed plan, giving informed consent.  The site of surgery properly noted/marked. The patient was taken to Operating Room # 7, identified as Claire Woods and the procedure verified as Total abdominal hysterectomy with Bilateral Salpingectomy . A Time Out was held and the above information confirmed.  After induction of anesthesia, the patient was draped and prepped in the usual sterile manner. Pt was placed in supine position after anesthesia and draped and prepped in the usual sterile manner. Foley catheter was placed.  A Midline vertical  incision was made and carried through the subcutaneous  tissue to the fascia. Fascial incision was made and extended superiorly and inferiorly . The rectus muscles were separated. The peritoneum was identified and entered. Peritoneal incision was extended longitudinally.  The above findings were noted. Balfour  retractor was placed Adhesions of the Colon to the right and left adnexa and the posterior aspect of the uterus were excised using sharp dissection. An hour was spent releasing pelvic adhesions. The bowel was then packed away with moist laparotomy sponges.    The round ligaments were identified, cut, and ligated with 0-Vicryl. The anterior peritoneal reflection was incised and the bladder was dissected off the lower uterine segment. The right fallopian tube grasped,dissected along the right mesosalpinx. The right utero-ovarian ligament was clamped cut, and suture ligated with 0-Vicryl. This was repeated on the left fallopian tube and uteroovarian ligament.  Hemostasis  was observed. The uterine vessels were skeletonized, then clamped, cut and suture ligated with 0-Vicryl suture. Serial pedicles of the cardinal ligaments were clamped, cut, and suture ligated with 0-Vicryl. To enhance visualization the uterus was amputated from the cervix. The utero sacral ligaments were clamped , cut and suture ligated with 0-Vicryl. Entrance was made into the vagina and the cervix  removed. Vaginal cuff angle sutures were placed incorporating the utero-sacral ligaments for support. The vaginal cuff was then closed with a running stitch of 0-Vicryl. Lavage was carried out until clear. Hemostasis was observed.  Retractor and all packing was removed from the abdomen. The fascia was approximated with running sutures of double stranded 0 PDS. Lavage was again carried out. Hemostasis was observed.  The subcutaneous layer was re approximated with 2-0 chromic. The skin was approximated with staples.  Instrument, sponge, and needle counts were correct prior to abdominal closure  and at the conclusion of the case.   Findings: Large Fibroid uterus with fibroids located in the broad ligament bilaterally. Adhesions of the Colon to the Adnexa bilaterally and posterior uterus. Normal ovaries and fallopian tubes.   Estimated Blood Loss:  850 mL               Total IV Fluids: per anesthesia ml               Complications:  None; patient tolerated the procedure well.

## 2016-07-31 NOTE — Transfer of Care (Signed)
Immediate Anesthesia Transfer of Care Note  Patient: Claire Woods  Procedure(s) Performed: Procedure(s): HYSTERECTOMY ABDOMINAL WITH SALPINGECTOMY (Bilateral) EXTENSIVE LYSIS OF ADHESIONS  Patient Location: PACU  Anesthesia Type:General  Level of Consciousness: awake, alert  and sedated  Airway & Oxygen Therapy: Patient Spontanous Breathing and Patient connected to nasal cannula oxygen  Post-op Assessment: Report given to RN and Post -op Vital signs reviewed and stable  Post vital signs: Reviewed and stable  Last Vitals:  Vitals:   07/31/16 0727 07/31/16 1245  BP: 134/87 134/71  Pulse: 86 (!) 116  Resp: 20 (!) 28  Temp: 36.7 C 37.2 C    Last Pain:  Vitals:   07/31/16 0727  TempSrc: Oral      Patients Stated Pain Goal: 5 (91/63/84 6659)  Complications: No apparent anesthesia complications

## 2016-07-31 NOTE — Anesthesia Procedure Notes (Signed)
Procedure Name: Intubation Date/Time: 07/31/2016 8:42 AM Performed by: Bufford Spikes Pre-anesthesia Checklist: Patient identified, Emergency Drugs available, Suction available and Patient being monitored Patient Re-evaluated:Patient Re-evaluated prior to inductionOxygen Delivery Method: Circle system utilized Preoxygenation: Pre-oxygenation with 100% oxygen Intubation Type: IV induction Ventilation: Mask ventilation without difficulty Laryngoscope Size: Miller and 2 Grade View: Grade II Tube type: Oral Tube size: 7.0 mm Number of attempts: 1 Airway Equipment and Method: Stylet and Oral airway Placement Confirmation: ETT inserted through vocal cords under direct vision,  positive ETCO2 and breath sounds checked- equal and bilateral Secured at: 20 cm Tube secured with: Tape Dental Injury: Teeth and Oropharynx as per pre-operative assessment

## 2016-07-31 NOTE — H&P (Signed)
Date of Initial H&P: 07/28/2016  History reviewed, patient examined, no change in status, stable for surgery.

## 2016-07-31 NOTE — Anesthesia Postprocedure Evaluation (Signed)
Anesthesia Post Note  Patient: Claire Woods  Procedure(s) Performed: Procedure(s) (LRB): HYSTERECTOMY ABDOMINAL WITH SALPINGECTOMY (Bilateral) EXTENSIVE LYSIS OF ADHESIONS  Patient location during evaluation: PACU Anesthesia Type: General Level of consciousness: awake and alert and oriented Pain management: pain level controlled Vital Signs Assessment: post-procedure vital signs reviewed and stable Respiratory status: spontaneous breathing, nonlabored ventilation, respiratory function stable and patient connected to nasal cannula oxygen Cardiovascular status: blood pressure returned to baseline and stable Postop Assessment: no signs of nausea or vomiting Anesthetic complications: no        Last Vitals:  Vitals:   07/31/16 1330 07/31/16 1345  BP: 124/83 (!) 133/91  Pulse: (!) 111 (!) 111  Resp: (!) 21 20  Temp:      Last Pain:  Vitals:   07/31/16 1345  TempSrc:   PainSc: 4    Pain Goal: Patients Stated Pain Goal: 5 (07/31/16 1315)               Perrin Gens A.

## 2016-07-31 NOTE — OR Nursing (Signed)
Surgical update called to family member in waiting room.

## 2016-08-01 LAB — CBC
HCT: 27.7 % — ABNORMAL LOW (ref 36.0–46.0)
Hemoglobin: 8.7 g/dL — ABNORMAL LOW (ref 12.0–15.0)
MCH: 24.6 pg — AB (ref 26.0–34.0)
MCHC: 31.4 g/dL (ref 30.0–36.0)
MCV: 78.5 fL (ref 78.0–100.0)
PLATELETS: 355 10*3/uL (ref 150–400)
RBC: 3.53 MIL/uL — AB (ref 3.87–5.11)
RDW: 18.3 % — ABNORMAL HIGH (ref 11.5–15.5)
WBC: 10.1 10*3/uL (ref 4.0–10.5)

## 2016-08-01 NOTE — Progress Notes (Signed)
1 Day Post-Op Procedure(s) (LRB): HYSTERECTOMY ABDOMINAL WITH SALPINGECTOMY (Bilateral) EXTENSIVE LYSIS OF ADHESIONS  Subjective: Patient reports incisional pain and tolerating PO.  Pt tolerating clear liquids. She would like to have regular food for breakfast. She denies nausea or emesis. She ambulated in the halls this morning. She rates her pain as 4 out 10.   Objective: I have reviewed patient's vital signs and intake and output.labs are pending.   General: alert, cooperative and no distress Resp: clear to auscultation bilaterally Cardio: regular rate and rhythm GI: soft appropriately tender nondistended , hypoactive bowel sounds.  Extremities: extremities normal, atraumatic, no cyanosis or edema  Assessment: s/p Procedure(s): HYSTERECTOMY ABDOMINAL WITH SALPINGECTOMY (Bilateral) EXTENSIVE LYSIS OF ADHESIONS: stable  Plan: Advance diet Encourage ambulation if patient tolerates breakfast plan to saling lock iv  and dc pca   D/c foley CBC pending if stable start lovenox  Lovenox 60 mg to start today at 1200. She should continue the lovenox for 2 weeks post surgery.  Dr. Simona Huh covering today 7a-7pm  LOS: 1 day    Trevis Eden J. 08/01/2016, 6:43 AM

## 2016-08-02 ENCOUNTER — Encounter (HOSPITAL_COMMUNITY): Payer: Self-pay | Admitting: Obstetrics and Gynecology

## 2016-08-02 MED ORDER — ENOXAPARIN SODIUM 60 MG/0.6ML ~~LOC~~ SOLN: 60.0000 mg | SUBCUTANEOUS | 0 refills | Status: DC

## 2016-08-02 MED ORDER — SENNOSIDES-DOCUSATE SODIUM 8.6-50 MG PO TABS: 1.0000 | ORAL_TABLET | Freq: Two times a day (BID) | ORAL | 1 refills | Status: DC

## 2016-08-02 MED ORDER — IBUPROFEN 600 MG PO TABS: 600.0000 mg | ORAL_TABLET | Freq: Four times a day (QID) | ORAL | 0 refills | Status: DC | PRN

## 2016-08-02 MED ORDER — BISACODYL 5 MG PO TBEC
5.0000 mg | DELAYED_RELEASE_TABLET | Freq: Every day | ORAL | Status: DC | PRN
  Administered 2016-08-02: 5 mg via ORAL
  Filled 2016-08-02 (×2): qty 1

## 2016-08-02 MED ORDER — HYDROCODONE-ACETAMINOPHEN 5-325 MG PO TABS: 1.0000 | ORAL_TABLET | Freq: Four times a day (QID) | ORAL | 0 refills | Status: DC | PRN

## 2016-08-02 NOTE — Discharge Instructions (Signed)
Abdominal Hysterectomy, Care After This sheet gives you information about how to care for yourself after your procedure. Your doctor may also give you more specific instructions. If you have problems or questions, contact your doctor. Follow these instructions at home: Bathing   Do not take baths, swim, or use a hot tub until your doctor says it is okay. Ask your doctor if you can take showers. You may only be allowed to take sponge baths for bathing.  Keep the bandage (dressing) dry until your doctor says it can be taken off. Surgical cut (  incision) care  Follow instructions from your doctor about how to take care of your cut from surgery. Make sure you:  Wash your hands with soap and water before you change your bandage (dressing). If you cannot use soap and water, use hand sanitizer.  Change your bandage as told by your doctor.  Leave stitches (sutures), skin glue, or skin tape (adhesive) strips in place. They may need to stay in place for 2 weeks or longer. If tape strips get loose and curl up, you may trim the loose edges. Do not remove tape strips completely unless your doctor says it is okay.  Check your surgical cut area every day for signs of infection. Check for:  Redness, swelling, or pain.  Fluid or blood.  Warmth.  Pus or a bad smell. Activity  Do gentle, daily exercise as told by your doctor. You may be told to take short walks every day and go farther each time.  Do not lift anything that is heavier than 10 lb (4.5 kg), or the limit that your doctor tells you, until he or she says that it is safe.  Do not drive or use heavy machinery while taking prescription pain medicine.  Do not drive for 24 hours if you were given a medicine to help you relax (sedative).  Follow your doctor's advice about exercise, driving, and general activities. Ask your doctor what activities are safe for you. Lifestyle  Do not douche, use tampons, or have sex for at least 6 weeks or  as told by your doctor.  Do not drink alcohol until your doctor says it is okay.  Drink enough fluid to keep your pee (urine) clear or pale yellow.  Try to have someone at home with you for the first 1-2 weeks to help.  Do not use any products that contain nicotine or tobacco, such as cigarettes and e-cigarettes. These can slow down healing. If you need help quitting, ask your doctor. General instructions  Take over-the-counter and prescription medicines only as told by your doctor.  For pain management you may alternate between the Hydrocodone and the Ibuprofen.  Hydrocodone will cause constipation, please take Sennokot S (stool softener) twice daily while on this medication  Please continue with Lovenox injections for the next 2 weeks.  Do not take aspirin or ibuprofen. These medicines can cause bleeding.  To prevent or treat constipation while you are taking prescription pain medicine, your doctor may suggest that you:  Drink enough fluid to keep your urine clear or pale yellow.  Take over-the-counter or prescription medicines.  Eat foods that are high in fiber, such as:  Fresh fruits and vegetables.  Whole grains.  Beans.  Limit foods that are high in fat and processed sugars, such as fried and sweet foods.  Keep all follow-up visits as told by your doctor. This is important. Contact a doctor if:  You have chills or fever.  You have redness, swelling, or pain around your cut.  You have fluid or blood coming from your cut.  Your cut feels warm to the touch.  You have pus or a bad smell coming from your cut.  Your cut breaks open.  You feel dizzy or light-headed.  You have pain or bleeding when you pee.  You keep having watery poop (diarrhea).  You keep feeling sick to your stomach (nauseous) or keep throwing up (vomiting).  You have unusual fluid (discharge) coming from your vagina.  You have a rash.  You have a reaction to your medicine.  Your pain  medicine does not help. Get help right away if:  You have a fever and your symptoms get worse all of a sudden.  You have very bad belly (abdominal) pain.  You are short of breath.  You pass out (faint).  You have pain, swelling, or redness of your leg.  You bleed a lot from your vagina and notice clumps of blood (clots). Summary  Do not take baths, swim, or use a hot tub until your doctor says it is okay. Ask your doctor if you can take showers. You may only be allowed to take sponge baths for bathing.  Follow your doctor's advice about exercise, driving, and general activities. Ask your doctor what activities are safe for you.  Do not lift anything that is heavier than 10 lb (4.5 kg), or the limit that your doctor tells you, until he or she says that it is safe.  Try to have someone at home with you for the first 1-2 weeks to help. This information is not intended to replace advice given to you by your health care provider. Make sure you discuss any questions you have with your health care provider. Document Released: 01/16/2008 Document Revised: 03/27/2016 Document Reviewed: 03/27/2016 Elsevier Interactive Patient Education  2017 [employer].

## 2016-08-02 NOTE — Progress Notes (Signed)
2 Days Post-Op Procedure(s) (LRB): HYSTERECTOMY ABDOMINAL WITH SALPINGECTOMY (Bilateral) EXTENSIVE LYSIS OF ADHESIONS  Subjective: Patient resting comfortably in bed.  States that her pain is well controlled.  Some ambulation.  No flatus, no BM.  Low grade temp overnight, denies chills.  Tolerating gen diet.  No nausea or vomiting.  Objective: BP 130/65 (BP Location: Right Arm)   Pulse 100   Temp 99 F (37.2 C)   Resp 18   Ht 5\' 7"  (1.702 m)   Wt 270 lb (122.5 kg)   LMP 07/26/2016 (Exact Date)   SpO2 100%   BMI 42.29 kg/m    General: alert, cooperative and no distress CV: No tachycardia noted Resp: clear to auscultation bilaterally GI: soft appropriately tender nondistended , hypoactive bowel sounds.  Incision with honeycomb- old blood noted otherwise C/D/I with staples Extremities: extremities normal, atraumatic, no cyanosis or edema, no calf tenderness bilaterally  Assessment: s/p Procedure(s): HYSTERECTOMY ABDOMINAL WITH SALPINGECTOMY (Bilateral) EXTENSIVE LYSIS OF ADHESIONS: stable  POD#2  Plan: Pain well controlled GI: No flatus or BM: Will add Dulcolax, encouraged ambulation today.  Tolerating gen diet GU: Voiding freely Heme: appropriate decline for recent surgery, pt asymptomatic Mild temp noted overnight- no evidence of infection on exam Lovenox 60 mg to start today at 1200. She should continue the lovenox for 2 weeks post surgery.   DISPO: Plan to work on flatus and monitor vitals today, if all stable and passing gas, will plan for discharge home later today.  Janyth Pupa, M 08/02/2016, 7:09 AM

## 2016-08-14 ENCOUNTER — Other Ambulatory Visit: Payer: Self-pay | Admitting: Obstetrics and Gynecology

## 2016-08-14 DIAGNOSIS — Z1231 Encounter for screening mammogram for malignant neoplasm of breast: Secondary | ICD-10-CM

## 2016-08-27 ENCOUNTER — Encounter (HOSPITAL_BASED_OUTPATIENT_CLINIC_OR_DEPARTMENT_OTHER): Payer: BC Managed Care – PPO | Attending: Surgery

## 2016-08-27 DIAGNOSIS — D649 Anemia, unspecified: Secondary | ICD-10-CM | POA: Diagnosis not present

## 2016-08-27 DIAGNOSIS — S31105A Unspecified open wound of abdominal wall, periumbilic region without penetration into peritoneal cavity, initial encounter: Secondary | ICD-10-CM | POA: Diagnosis not present

## 2016-08-27 DIAGNOSIS — I10 Essential (primary) hypertension: Secondary | ICD-10-CM | POA: Diagnosis not present

## 2016-08-27 DIAGNOSIS — Y839 Surgical procedure, unspecified as the cause of abnormal reaction of the patient, or of later complication, without mention of misadventure at the time of the procedure: Secondary | ICD-10-CM | POA: Diagnosis not present

## 2016-08-27 DIAGNOSIS — L930 Discoid lupus erythematosus: Secondary | ICD-10-CM | POA: Diagnosis not present

## 2016-08-27 DIAGNOSIS — T8131XA Disruption of external operation (surgical) wound, not elsewhere classified, initial encounter: Secondary | ICD-10-CM | POA: Insufficient documentation

## 2016-08-29 NOTE — Discharge Summary (Signed)
Physician Discharge Summary  Patient ID: Claire Woods MRN: [id number] DOB/AGE: 1970-07-10 46 y.o.  Admit date: 07/31/2016 Discharge date:08/02/2016 Admission Diagnoses:Uterine Fibroids/ Menorrhagia/ Pelvic pain / Anemia   Discharge Diagnoses: same s/p Total abdominal hysterctomy and bilateral salpingectomy  Active Problems:   S/P TAH (total abdominal hysterectomy)   Discharged Condition: stable  Hospital Course: pt was admitted on 07/31/2016 after undergoing total abdominal hysterctomy with extensive lysis of adhesions and bilateral salpingectomy. She did well postioperative ly and was discharged homeo n pod #2 with return of bowel and bladder function.   Consults: None  Significant Diagnostic Studies: labs hgb 07/31/2016 10.0 and 4/12/ 2018 hgb 8.7  Treatments: surgery: Total abdominal hysterectomy with bilateral salpingectomy and extensive lysis of adhesions.    Disposition: 01-Home or Self Care   Allergies as of 08/02/2016   No Known Allergies     Medication List    STOP taking these medications   acetaminophen 650 MG CR tablet Commonly known as:  TYLENOL     TAKE these medications   enoxaparin 60 MG/0.6ML injection Commonly known as:  LOVENOX Inject 0.6 mLs (60 mg total) into the skin daily.   fluconazole 150 MG tablet Commonly known as:  DIFLUCAN Take 150 mg by mouth daily.   HYDROcodone-acetaminophen 5-325 MG tablet Commonly known as:  NORCO/VICODIN Take 1 tablet by mouth every 6 (six) hours as needed for moderate pain. What changed:  when to take this  reasons to take this   ibuprofen 600 MG tablet Commonly known as:  ADVIL,MOTRIN Take 1 tablet (600 mg total) by mouth every 6 (six) hours as needed (mild pain).   quinapril-hydrochlorothiazide 20-25 MG tablet Commonly known as:  ACCURETIC Take 1 tablet by mouth daily.   senna-docusate 8.6-50 MG tablet Commonly known as:  SENOKOT S Take 1 tablet by mouth 2 (two) times daily.   triamcinolone cream  0.1 % Commonly known as:  KENALOG Apply 1 application topically 2 (two) times daily as needed (for ezcema (dry/itchy skin)).      Follow-up Information    Christophe Louis, MD Follow up in 2 week(s).   Specialty:  Obstetrics and Gynecology Contact information: [address] [place] [address] [phone]           Signed: Catha Brow. 08/29/2016, 10:18 AM

## 2016-09-02 ENCOUNTER — Encounter (HOSPITAL_BASED_OUTPATIENT_CLINIC_OR_DEPARTMENT_OTHER): Payer: BC Managed Care – PPO

## 2016-09-03 ENCOUNTER — Ambulatory Visit
Admission: RE | Admit: 2016-09-03 | Discharge: 2016-09-03 | Disposition: A | Discharge status: Home / Self Care | Payer: BC Managed Care – PPO | Source: Ambulatory Visit | Attending: Obstetrics and Gynecology | Admitting: Obstetrics and Gynecology

## 2016-09-03 DIAGNOSIS — Z1231 Encounter for screening mammogram for malignant neoplasm of breast: Secondary | ICD-10-CM

## 2016-09-03 DIAGNOSIS — S31105A Unspecified open wound of abdominal wall, periumbilic region without penetration into peritoneal cavity, initial encounter: Secondary | ICD-10-CM | POA: Diagnosis not present

## 2016-09-10 DIAGNOSIS — S31105A Unspecified open wound of abdominal wall, periumbilic region without penetration into peritoneal cavity, initial encounter: Secondary | ICD-10-CM | POA: Diagnosis not present

## 2017-04-09 NOTE — Progress Notes (Signed)
Spoke with Dr. Tommie Sams regarding pts anticardiolipin antibody history and need for anticoagulant therapy postop. She will touch base with Dr. Beryle Beams prior to day of surgery.

## 2017-04-10 ENCOUNTER — Other Ambulatory Visit: Payer: Self-pay

## 2017-04-10 ENCOUNTER — Encounter (HOSPITAL_BASED_OUTPATIENT_CLINIC_OR_DEPARTMENT_OTHER): Payer: Self-pay | Admitting: *Deleted

## 2017-04-17 ENCOUNTER — Ambulatory Visit: Payer: Self-pay | Admitting: Plastic Surgery

## 2017-04-17 ENCOUNTER — Encounter (HOSPITAL_BASED_OUTPATIENT_CLINIC_OR_DEPARTMENT_OTHER)
Admission: RE | Admit: 2017-04-17 | Discharge: 2017-04-17 | Disposition: A | Discharge status: Home / Self Care | Payer: BC Managed Care – PPO | Source: Ambulatory Visit | Attending: Plastic Surgery | Admitting: Plastic Surgery

## 2017-04-17 ENCOUNTER — Encounter: Payer: Self-pay | Admitting: Oncology

## 2017-04-17 DIAGNOSIS — N62 Hypertrophy of breast: Secondary | ICD-10-CM | POA: Diagnosis not present

## 2017-04-17 DIAGNOSIS — I10 Essential (primary) hypertension: Secondary | ICD-10-CM | POA: Diagnosis not present

## 2017-04-17 LAB — COMPREHENSIVE METABOLIC PANEL
ALK PHOS: 103 U/L (ref 38–126)
ALT: 22 U/L (ref 14–54)
AST: 26 U/L (ref 15–41)
Albumin: 3.3 g/dL — ABNORMAL LOW (ref 3.5–5.0)
Anion gap: 8 (ref 5–15)
BUN: 9 mg/dL (ref 6–20)
CALCIUM: 9 mg/dL (ref 8.9–10.3)
CO2: 24 mmol/L (ref 22–32)
CREATININE: 0.63 mg/dL (ref 0.44–1.00)
Chloride: 104 mmol/L (ref 101–111)
GFR calc Af Amer: >60 mL/min (ref >60)
GFR calc non Af Amer: >60 mL/min (ref >60)
GLUCOSE: 208 mg/dL — AB (ref 65–99)
Potassium: 3.7 mmol/L (ref 3.5–5.1)
SODIUM: 136 mmol/L (ref 135–145)
Total Bilirubin: 0.5 mg/dL (ref 0.3–1.2)
Total Protein: 7.3 g/dL (ref 6.5–8.1)

## 2017-04-17 LAB — CBC
HCT: 36.8 % (ref 36.0–46.0)
HEMOGLOBIN: 11.6 g/dL — AB (ref 12.0–15.0)
MCH: 24 pg — AB (ref 26.0–34.0)
MCHC: 31.5 g/dL (ref 30.0–36.0)
MCV: 76 fL — ABNORMAL LOW (ref 78.0–100.0)
PLATELETS: 387 10*3/uL (ref 150–400)
RBC: 4.84 MIL/uL (ref 3.87–5.11)
RDW: 18.1 % — ABNORMAL HIGH (ref 11.5–15.5)
WBC: 5.3 10*3/uL (ref 4.0–10.5)

## 2017-04-17 NOTE — Progress Notes (Signed)
Had Dr Sherryl Manges review note from Dr Beryle Beams regarding patient being antiphospholipid antibody positive. Patient okay to have surgery as per Dr Sherryl Manges.

## 2017-04-17 NOTE — Progress Notes (Signed)
Phone conversation with Dr. Berkley Harvey, plastic surgeon, about this patient. 46 year old woman I saw for office consultation back in October 2017.  She had a history of 2 first trimester pregnancy losses.  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  She had a high positive ANA 1: 320 and elevated anti-RNP antibodies.  She tested positive for the lupus anticoagulant and had positive IgM antibodies against beta-2 glycoprotein 1.  I repeated laboratory studies at the time of her visit.  Lupus anticoagulant and anti-beta-2 glycoprotein 1 antibodies reproducibly positive as well as elevation of anticardiolipin antibodies both IgG and IgM.  She has a strong positive family history for lupus.  She had no personal history of a prior thrombotic event.  I felt she did not meet criteria for the antiphospholipid antibody syndrome.  However I did think that she would be at high risk for thrombosis around any subsequent pregnancy or surgery. She underwent an uncomplicated hysterectomy and bilateral salpingectomy in April 2018.  I recommended prophylactic dose Lovenox 60 mg daily for 2 weeks. She now has scheduled breast reduction surgery.  I am again recommending Lovenox prophylactic dose subcutaneous daily for 2 weeks.  Surgeon will contact the patient and send in a prescription for the drug.  Surgery is scheduled for December 28.

## 2017-04-18 ENCOUNTER — Other Ambulatory Visit: Payer: Self-pay

## 2017-04-18 ENCOUNTER — Ambulatory Visit (HOSPITAL_BASED_OUTPATIENT_CLINIC_OR_DEPARTMENT_OTHER): Payer: BC Managed Care – PPO | Admitting: Certified Registered"

## 2017-04-18 ENCOUNTER — Ambulatory Visit (HOSPITAL_BASED_OUTPATIENT_CLINIC_OR_DEPARTMENT_OTHER)
Admission: RE | Admit: 2017-04-18 | Discharge: 2017-04-18 | Disposition: A | Discharge status: Home / Self Care | Payer: BC Managed Care – PPO | Source: Ambulatory Visit | Attending: Plastic Surgery | Admitting: Plastic Surgery

## 2017-04-18 ENCOUNTER — Encounter (HOSPITAL_BASED_OUTPATIENT_CLINIC_OR_DEPARTMENT_OTHER): Payer: Self-pay | Admitting: Anesthesiology

## 2017-04-18 ENCOUNTER — Encounter (HOSPITAL_BASED_OUTPATIENT_CLINIC_OR_DEPARTMENT_OTHER)
Admission: RE | Disposition: A | Discharge status: Home / Self Care | Payer: Self-pay | Source: Ambulatory Visit | Attending: Plastic Surgery

## 2017-04-18 DIAGNOSIS — N62 Hypertrophy of breast: Secondary | ICD-10-CM | POA: Diagnosis not present

## 2017-04-18 DIAGNOSIS — I10 Essential (primary) hypertension: Secondary | ICD-10-CM | POA: Insufficient documentation

## 2017-04-18 HISTORY — PX: BREAST REDUCTION SURGERY: SHX8

## 2017-04-18 LAB — GLUCOSE, CAPILLARY: Glucose-Capillary: 170 mg/dL — ABNORMAL HIGH (ref 65–99)

## 2017-04-18 SURGERY — MAMMOPLASTY, REDUCTION
Anesthesia: General | Laterality: Bilateral

## 2017-04-18 MED ORDER — BUPIVACAINE HCL (PF) 0.25 % IJ SOLN: INTRAMUSCULAR | Status: DC | PRN

## 2017-04-18 MED ORDER — BACITRACIN ZINC 500 UNIT/GM EX OINT
TOPICAL_OINTMENT | CUTANEOUS | Status: DC | PRN
  Administered 2017-04-18: 1 via TOPICAL

## 2017-04-18 MED ORDER — CHLORHEXIDINE GLUCONATE CLOTH 2 % EX PADS: 6.0000 | MEDICATED_PAD | Freq: Once | CUTANEOUS | Status: DC

## 2017-04-18 MED ORDER — CEFAZOLIN SODIUM-DEXTROSE 2-4 GM/100ML-% IV SOLN
INTRAVENOUS | Status: AC
  Filled 2017-04-18: qty 100

## 2017-04-18 MED ORDER — DEXAMETHASONE SODIUM PHOSPHATE 10 MG/ML IJ SOLN
INTRAMUSCULAR | Status: AC
  Filled 2017-04-18: qty 1

## 2017-04-18 MED ORDER — HYDROMORPHONE HCL 1 MG/ML IJ SOLN
INTRAMUSCULAR | Status: AC
  Filled 2017-04-18: qty 0.5

## 2017-04-18 MED ORDER — BUPIVACAINE LIPOSOME 1.3 % IJ SUSP
INTRAMUSCULAR | Status: AC
  Filled 2017-04-18: qty 40

## 2017-04-18 MED ORDER — MIDAZOLAM HCL 2 MG/2ML IJ SOLN
1.0000 mg | INTRAMUSCULAR | Status: DC | PRN
  Administered 2017-04-18: 2 mg via INTRAVENOUS

## 2017-04-18 MED ORDER — ONDANSETRON HCL 4 MG/2ML IJ SOLN
INTRAMUSCULAR | Status: AC
  Filled 2017-04-18: qty 2

## 2017-04-18 MED ORDER — LIDOCAINE HCL (PF) 1 % IJ SOLN
INTRAMUSCULAR | Status: AC
  Filled 2017-04-18: qty 30

## 2017-04-18 MED ORDER — SUFENTANIL CITRATE 50 MCG/ML IV SOLN
INTRAVENOUS | Status: DC | PRN
  Administered 2017-04-18 (×3): 10 ug via INTRAVENOUS
  Administered 2017-04-18: 20 ug via INTRAVENOUS

## 2017-04-18 MED ORDER — BUPIVACAINE HCL (PF) 0.5 % IJ SOLN
INTRAMUSCULAR | Status: DC | PRN
  Administered 2017-04-18: 30 mL

## 2017-04-18 MED ORDER — SUCCINYLCHOLINE CHLORIDE 200 MG/10ML IV SOSY
PREFILLED_SYRINGE | INTRAVENOUS | Status: AC
  Filled 2017-04-18: qty 10

## 2017-04-18 MED ORDER — CEFAZOLIN SODIUM-DEXTROSE 1-4 GM/50ML-% IV SOLN
INTRAVENOUS | Status: AC
  Filled 2017-04-18: qty 50

## 2017-04-18 MED ORDER — ALBUTEROL SULFATE HFA 108 (90 BASE) MCG/ACT IN AERS
INHALATION_SPRAY | RESPIRATORY_TRACT | Status: AC
  Filled 2017-04-18: qty 6.7

## 2017-04-18 MED ORDER — LIDOCAINE-EPINEPHRINE 1 %-1:100000 IJ SOLN
INTRAMUSCULAR | Status: AC
  Filled 2017-04-18: qty 2

## 2017-04-18 MED ORDER — SODIUM CHLORIDE 0.9 % IJ SOLN
INTRAMUSCULAR | Status: DC | PRN
  Administered 2017-04-18: 50 mL

## 2017-04-18 MED ORDER — BACITRACIN ZINC 500 UNIT/GM EX OINT
TOPICAL_OINTMENT | CUTANEOUS | Status: AC
  Filled 2017-04-18: qty 28.35

## 2017-04-18 MED ORDER — DEXAMETHASONE SODIUM PHOSPHATE 4 MG/ML IJ SOLN
INTRAMUSCULAR | Status: DC | PRN
  Administered 2017-04-18: 10 mg via INTRAVENOUS

## 2017-04-18 MED ORDER — BUPIVACAINE HCL (PF) 0.25 % IJ SOLN
INTRAMUSCULAR | Status: AC
  Filled 2017-04-18: qty 60

## 2017-04-18 MED ORDER — SUGAMMADEX SODIUM 200 MG/2ML IV SOLN
INTRAVENOUS | Status: AC
  Filled 2017-04-18: qty 2

## 2017-04-18 MED ORDER — LIDOCAINE 2% (20 MG/ML) 5 ML SYRINGE
INTRAMUSCULAR | Status: AC
  Filled 2017-04-18: qty 5

## 2017-04-18 MED ORDER — LIDOCAINE-EPINEPHRINE 1 %-1:100000 IJ SOLN: INTRAMUSCULAR | Status: DC | PRN

## 2017-04-18 MED ORDER — DEXTROSE 5 % IV SOLN: 3.0000 g | Freq: Once | INTRAVENOUS | Status: DC

## 2017-04-18 MED ORDER — SODIUM CHLORIDE 0.9 % IJ SOLN
INTRAMUSCULAR | Status: DC | PRN
  Administered 2017-04-18: 60 mL

## 2017-04-18 MED ORDER — BUPIVACAINE HCL (PF) 0.5 % IJ SOLN
INTRAMUSCULAR | Status: AC
  Filled 2017-04-18: qty 60

## 2017-04-18 MED ORDER — PROPOFOL 10 MG/ML IV BOLUS
INTRAVENOUS | Status: DC | PRN
  Administered 2017-04-18: 200 mg via INTRAVENOUS

## 2017-04-18 MED ORDER — FENTANYL CITRATE (PF) 100 MCG/2ML IJ SOLN: 50.0000 ug | INTRAMUSCULAR | Status: DC | PRN

## 2017-04-18 MED ORDER — ONDANSETRON HCL 4 MG/2ML IJ SOLN
INTRAMUSCULAR | Status: DC | PRN
  Administered 2017-04-18: 4 mg via INTRAVENOUS

## 2017-04-18 MED ORDER — ROCURONIUM BROMIDE 100 MG/10ML IV SOLN
INTRAVENOUS | Status: DC | PRN
  Administered 2017-04-18: 20 mg via INTRAVENOUS
  Administered 2017-04-18: 60 mg via INTRAVENOUS
  Administered 2017-04-18: 20 mg via INTRAVENOUS

## 2017-04-18 MED ORDER — SCOPOLAMINE 1 MG/3DAYS TD PT72: 1.0000 | MEDICATED_PATCH | Freq: Once | TRANSDERMAL | Status: DC | PRN

## 2017-04-18 MED ORDER — MIDAZOLAM HCL 2 MG/2ML IJ SOLN
INTRAMUSCULAR | Status: AC
  Filled 2017-04-18: qty 2

## 2017-04-18 MED ORDER — HYDROMORPHONE HCL 1 MG/ML IJ SOLN
0.2500 mg | INTRAMUSCULAR | Status: DC | PRN
  Administered 2017-04-18 (×3): 0.5 mg via INTRAVENOUS

## 2017-04-18 MED ORDER — SODIUM CHLORIDE 0.9 % IJ SOLN
INTRAMUSCULAR | Status: AC
  Filled 2017-04-18: qty 10

## 2017-04-18 MED ORDER — BUPIVACAINE LIPOSOME 1.3 % IJ SUSP
INTRAMUSCULAR | Status: DC | PRN
  Administered 2017-04-18: 20 mL

## 2017-04-18 MED ORDER — SUFENTANIL CITRATE 50 MCG/ML IV SOLN
INTRAVENOUS | Status: AC
  Filled 2017-04-18: qty 1

## 2017-04-18 MED ORDER — EPHEDRINE SULFATE 50 MG/ML IJ SOLN
INTRAMUSCULAR | Status: DC | PRN
  Administered 2017-04-18: 15 mg via INTRAVENOUS

## 2017-04-18 MED ORDER — SUGAMMADEX SODIUM 200 MG/2ML IV SOLN
INTRAVENOUS | Status: DC | PRN
  Administered 2017-04-18: 200 mg via INTRAVENOUS

## 2017-04-18 MED ORDER — LACTATED RINGERS IV SOLN
INTRAVENOUS | Status: DC
  Administered 2017-04-18 (×2): via INTRAVENOUS

## 2017-04-18 MED ORDER — LIDOCAINE HCL (CARDIAC) 20 MG/ML IV SOLN
INTRAVENOUS | Status: DC | PRN
  Administered 2017-04-18: 50 mg via INTRAVENOUS

## 2017-04-18 MED ORDER — CEFAZOLIN SODIUM-DEXTROSE 2-4 GM/100ML-% IV SOLN
2.0000 g | INTRAVENOUS | Status: DC
  Administered 2017-04-18: 3 g via INTRAVENOUS

## 2017-04-18 MED ORDER — SODIUM CHLORIDE 0.9 % IJ SOLN
INTRAMUSCULAR | Status: AC
  Filled 2017-04-18: qty 20

## 2017-04-18 SURGICAL SUPPLY — 65 items
APL SKNCLS STERI-STRIP NONHPOA (GAUZE/BANDAGES/DRESSINGS) ×2
BAG DECANTER FOR FLEXI CONT (MISCELLANEOUS) ×3 IMPLANT
BENZOIN TINCTURE PRP APPL 2/3 (GAUZE/BANDAGES/DRESSINGS) ×6 IMPLANT
BLADE KNIFE PERSONA 10 (BLADE) ×14 IMPLANT
BLADE KNIFE PERSONA 15 (BLADE) ×9 IMPLANT
BNDG GAUZE ELAST 4 BULKY (GAUZE/BANDAGES/DRESSINGS) ×6 IMPLANT
CANISTER SUCT 1200ML W/VALVE (MISCELLANEOUS) ×3 IMPLANT
CAP BOUFFANT 24 BLUE NURSES (PROTECTIVE WEAR) ×3 IMPLANT
CLOSURE WOUND 1/2 X4 (GAUZE/BANDAGES/DRESSINGS) ×4
COVER BACK TABLE 60X90IN (DRAPES) ×3 IMPLANT
COVER MAYO STAND STRL (DRAPES) ×3 IMPLANT
DECANTER SPIKE VIAL GLASS SM (MISCELLANEOUS) ×6 IMPLANT
DRAIN CHANNEL 10F 3/8 F FF (DRAIN) ×6 IMPLANT
DRAPE LAPAROSCOPIC ABDOMINAL (DRAPES) ×1 IMPLANT
DRAPE U-SHAPE 76X120 STRL (DRAPES) ×6 IMPLANT
DRSG EMULSION OIL 3X3 NADH (GAUZE/BANDAGES/DRESSINGS) ×6 IMPLANT
DRSG PAD ABDOMINAL 8X10 ST (GAUZE/BANDAGES/DRESSINGS) ×6 IMPLANT
ELECT REM PT RETURN 9FT ADLT (ELECTROSURGICAL) ×3
ELECTRODE REM PT RTRN 9FT ADLT (ELECTROSURGICAL) ×1 IMPLANT
EVACUATOR SILICONE 100CC (DRAIN) ×6 IMPLANT
FILTER 7/8 IN (FILTER) IMPLANT
GAUZE SPONGE 4X4 12PLY STRL (GAUZE/BANDAGES/DRESSINGS) ×6 IMPLANT
GLOVE BIO SURGEON STRL SZ 6.5 (GLOVE) ×4 IMPLANT
GLOVE BIO SURGEON STRL SZ7 (GLOVE) ×7 IMPLANT
GLOVE BIO SURGEONS STRL SZ 6.5 (GLOVE) ×2
GOWN STRL REUS W/ TWL LRG LVL3 (GOWN DISPOSABLE) ×2 IMPLANT
GOWN STRL REUS W/TWL LRG LVL3 (GOWN DISPOSABLE) ×6
IV NS 250ML (IV SOLUTION) ×3
IV NS 250ML BAXH (IV SOLUTION) ×1 IMPLANT
NDL HYPO 25X1 1.5 SAFETY (NEEDLE) ×3 IMPLANT
NDL SAFETY ECLIPSE 18X1.5 (NEEDLE) ×1 IMPLANT
NDL SPNL 18GX3.5 QUINCKE PK (NEEDLE) ×1 IMPLANT
NEEDLE HYPO 18GX1.5 SHARP (NEEDLE) ×3
NEEDLE HYPO 25X1 1.5 SAFETY (NEEDLE) ×9 IMPLANT
NEEDLE SPNL 18GX3.5 QUINCKE PK (NEEDLE) ×3 IMPLANT
NS IRRIG 1000ML POUR BTL (IV SOLUTION) ×6 IMPLANT
PACK BASIN DAY SURGERY FS (CUSTOM PROCEDURE TRAY) ×3 IMPLANT
PIN SAFETY STERILE (MISCELLANEOUS) ×3 IMPLANT
SCRUB TECHNI CARE 4 OZ NO DYE (MISCELLANEOUS) ×3 IMPLANT
SLEEVE SCD COMPRESS KNEE MED (MISCELLANEOUS) ×3 IMPLANT
SPECIMEN JAR MEDIUM (MISCELLANEOUS) ×3 IMPLANT
SPECIMEN JAR X LARGE (MISCELLANEOUS) ×2 IMPLANT
SPONGE LAP 18X18 X RAY DECT (DISPOSABLE) ×11 IMPLANT
STAPLER VISISTAT 35W (STAPLE) ×6 IMPLANT
STRIP CLOSURE SKIN 1/2X4 (GAUZE/BANDAGES/DRESSINGS) ×8 IMPLANT
SUT ETHILON 3 0 PS 1 (SUTURE) ×3 IMPLANT
SUT MNCRL AB 3-0 PS2 18 (SUTURE) ×8 IMPLANT
SUT MNCRL AB 4-0 PS2 18 (SUTURE) ×6 IMPLANT
SUT MON AB 5-0 PS2 18 (SUTURE) ×6 IMPLANT
SUT PROLENE 2 0 CT2 30 (SUTURE) ×3 IMPLANT
SUT PROLENE 3 0 PS 1 (SUTURE) ×6 IMPLANT
SUT VLOC 90 P-14 23 (SUTURE) ×8 IMPLANT
SYR BULB IRRIGATION 50ML (SYRINGE) ×6 IMPLANT
SYR CONTROL 10ML LL (SYRINGE) ×6 IMPLANT
TAPE MEASURE VINYL STERILE (MISCELLANEOUS) ×3 IMPLANT
TOWEL OR 17X24 6PK STRL BLUE (TOWEL DISPOSABLE) ×9 IMPLANT
TOWEL OR NON WOVEN STRL DISP B (DISPOSABLE) IMPLANT
TRAY DSU PREP LF (CUSTOM PROCEDURE TRAY) ×3 IMPLANT
TRAY FOLEY BAG SILVER LF 14FR (SET/KITS/TRAYS/PACK) ×2 IMPLANT
TRAY FOLEY BAG SILVER LF 16FR (SET/KITS/TRAYS/PACK) IMPLANT
TUBE CONNECTING 20'X1/4 (TUBING) ×1
TUBE CONNECTING 20X1/4 (TUBING) ×2 IMPLANT
UNDERPAD 30X30 (UNDERPADS AND DIAPERS) ×6 IMPLANT
VAC PENCILS W/TUBING CLEAR (MISCELLANEOUS) ×3 IMPLANT
YANKAUER SUCT BULB TIP NO VENT (SUCTIONS) ×3 IMPLANT

## 2017-04-18 NOTE — H&P (Signed)
  H&P faxed to surgical center.  -History and Physical Reviewed  -Patient has been re-examined  -No change in the plan of care  CONTOGIANNIS,MARY A    

## 2017-04-18 NOTE — Brief Op Note (Signed)
04/18/2017  3:20 PM  PATIENT:  Claire Woods  46 y.o. female  PRE-OPERATIVE DIAGNOSIS:  Bilateral Macromastia  POST-OPERATIVE DIAGNOSIS:  Bilateral Macromastia  PROCEDURE:  Procedure(s): MAMMARY REDUCTION  (BREAST) BILATERAL (Bilateral)  SURGEON:  Surgeon(s) and Role:    * Contogiannis, Audrea Muscat, MD - Primary  ANESTHESIA:   general  EBL:  175 mL   BLOOD ADMINISTERED:none  DRAINS: (27F) Jackson-Pratt drain(s) with closed bulb suction in the Bilateral breasts   LOCAL MEDICATIONS USED:  1.3% Exparel (266 mgs. Total)  SPECIMEN:  Source of Specimen:  Bilateral breasts  DISPOSITION OF SPECIMEN:  PATHOLOGY  COUNTS:  YES  DICTATION: .Note written in EPIC  PLAN OF CARE: Discharge to home after PACU  PATIENT DISPOSITION:  PACU - hemodynamically stable.   Delay start of Pharmacological VTE agent (>24hrs) due to surgical blood loss or risk of bleeding: not applicable

## 2017-04-18 NOTE — Transfer of Care (Signed)
Immediate Anesthesia Transfer of Care Note  Patient: Claire Woods  Procedure(s) Performed: MAMMARY REDUCTION  (BREAST) BILATERAL (Bilateral )  Patient Location: PACU  Anesthesia Type:General  Level of Consciousness: awake and sedated  Airway & Oxygen Therapy: Patient Spontanous Breathing and Patient connected to face mask oxygen  Post-op Assessment: Report given to RN and Post -op Vital signs reviewed and stable  Post vital signs: Reviewed and stable  Last Vitals:  Vitals:   04/18/17 1003  BP: (!) 177/96  Pulse: 78  Resp: 20  Temp: 36.8 C  SpO2: 98%    Last Pain:  Vitals:   04/18/17 1003  TempSrc: Oral  PainSc: 0-No pain      Patients Stated Pain Goal: 3 (47/99/87 2158)  Complications: No apparent anesthesia complications

## 2017-04-18 NOTE — Anesthesia Procedure Notes (Signed)
Procedure Name: Intubation Date/Time: 04/18/2017 10:45 AM Performed by: Willa Frater, CRNA Pre-anesthesia Checklist: Patient identified, Emergency Drugs available, Suction available and Patient being monitored Patient Re-evaluated:Patient Re-evaluated prior to induction Oxygen Delivery Method: Circle system utilized Preoxygenation: Pre-oxygenation with 100% oxygen Induction Type: IV induction Ventilation: Mask ventilation without difficulty Laryngoscope Size: Mac and 3 Grade View: Grade II Tube type: Oral Number of attempts: 1 Airway Equipment and Method: Stylet and Oral airway Placement Confirmation: ETT inserted through vocal cords under direct vision,  positive ETCO2 and breath sounds checked- equal and bilateral Secured at: 20 cm Tube secured with: Tape Dental Injury: Teeth and Oropharynx as per pre-operative assessment

## 2017-04-18 NOTE — Anesthesia Preprocedure Evaluation (Addendum)
Anesthesia Evaluation  Patient identified by MRN, date of birth, ID band Patient awake    Reviewed: Allergy & Precautions, H&P , NPO status , Patient's Chart, lab work & pertinent test results  Airway Mallampati: II  TM Distance: >3 FB Neck ROM: Full    Dental no notable dental hx. (+) Teeth Intact, Dental Advisory Given   Pulmonary neg pulmonary ROS,    Pulmonary exam normal breath sounds clear to auscultation       Cardiovascular hypertension, Pt. on medications  Rhythm:Regular Rate:Normal     Neuro/Psych  Headaches, Anxiety    GI/Hepatic negative GI ROS, Neg liver ROS,   Endo/Other  Morbid obesity  Renal/GU negative Renal ROS  negative genitourinary   Musculoskeletal  (+) Arthritis ,   Abdominal   Peds  Hematology negative hematology ROS (+) Antiphospholipid antibody syndrome   Anesthesia Other Findings   Reproductive/Obstetrics negative OB ROS                            Anesthesia Physical Anesthesia Plan  ASA: III  Anesthesia Plan: General   Post-op Pain Management:    Induction: Intravenous  PONV Risk Score and Plan: 4 or greater and Ondansetron, Dexamethasone and Midazolam  Airway Management Planned: Oral ETT  Additional Equipment:   Intra-op Plan:   Post-operative Plan: Extubation in OR  Informed Consent: I have reviewed the patients History and Physical, chart, labs and discussed the procedure including the risks, benefits and alternatives for the proposed anesthesia with the patient or authorized representative who has indicated his/her understanding and acceptance.   Dental advisory given  Plan Discussed with: CRNA  Anesthesia Plan Comments:         Anesthesia Quick Evaluation

## 2017-04-18 NOTE — Discharge Instructions (Signed)
1. No lifting greater than 5 lbs with arms for 4 weeks. 2. Empty, strip, record and reactivate JP drains 3 times a day. 3. Percocet 5/325 mg tabs 1-2 tabs po q 4-6 hours prn pain- prescription given in office. 4. Duricef 1 tab po bid- prescription given in office. 5. Sterapred dose pack as directed- prescription given in office. 6. Lovenox 60 mgs. Subcutaneously daily beginning tonight- prescription called to [facility]. 6. Follow-up appointment Monday in office.   About my Jackson-Pratt Bulb Drain  What is a Jackson-Pratt bulb? A Jackson-Pratt is a soft, round device used to collect drainage. It is connected to a long, thin drainage catheter, which is held in place by one or two small stiches near your surgical incision site. When the bulb is squeezed, it forms a vacuum, forcing the drainage to empty into the bulb.  Emptying the Jackson-Pratt bulb- To empty the bulb: 1. Release the plug on the top of the bulb. 2. Pour the bulb's contents into a measuring container which your nurse will provide. 3. Record the time emptied and amount of drainage. Empty the drain(s) as often as your     doctor or nurse recommends.  Date                  Time                    Amount (Drain 1)                 Amount (Drain 2)  _____________________________________________________________________  _____________________________________________________________________  _____________________________________________________________________  _____________________________________________________________________  _____________________________________________________________________  _____________________________________________________________________  _____________________________________________________________________  _____________________________________________________________________  Squeezing the Jackson-Pratt Bulb- To squeeze the bulb: 1. Make sure the plug at the top of the bulb is open. 2. Squeeze  the bulb tightly in your fist. You will hear air squeezing from the bulb. 3. Replace the plug while the bulb is squeezed. 4. Use a safety pin to attach the bulb to your clothing. This will keep the catheter from     pulling at the bulb insertion site.  When to call your doctor- Call your doctor if:  Drain site becomes red, swollen or hot.  You have a fever greater than 101 degrees F.  There is oozing at the drain site.  Drain falls out (apply a guaze bandage over the drain hole and secure it with tape).  Drainage increases daily not related to activity patterns. (You will usually have more drainage when you are active than when you are resting.)  Drainage has a bad odor.   Post Anesthesia Home Care Instructions  Activity: Get plenty of rest for the remainder of the day. A responsible individual must stay with you for 24 hours following the procedure.  For the next 24 hours, DO NOT: -Drive a car -Paediatric nurse -Drink alcoholic beverages -Take any medication unless instructed by your physician -Make any legal decisions or sign important papers.  Meals: Start with liquid foods such as gelatin or soup. Progress to regular foods as tolerated. Avoid greasy, spicy, heavy foods. If nausea and/or vomiting occur, drink only clear liquids until the nausea and/or vomiting subsides. Call your physician if vomiting continues.  Special Instructions/Symptoms: Your throat may feel dry or sore from the anesthesia or the breathing tube placed in your throat during surgery. If this causes discomfort, gargle with warm salt water. The discomfort should disappear within 24 hours.  If you had a scopolamine patch placed behind your ear for the management of post-  operative nausea and/or vomiting:  1. The medication in the patch is effective for 72 hours, after which it should be removed.  Wrap patch in a tissue and discard in the trash. Wash hands thoroughly with soap and water. 2. You may remove  the patch earlier than 72 hours if you experience unpleasant side effects which may include dry mouth, dizziness or visual disturbances. 3. Avoid touching the patch. Wash your hands with soap and water after contact with the patch.  Information for Discharge Teaching: EXPAREL (bupivacaine liposome injectable suspension)   Your surgeon gave you EXPAREL(bupivacaine) in your surgical incision to help control your pain after surgery.   EXPAREL is a local anesthetic that provides pain relief by numbing the tissue around the surgical site.  EXPAREL is designed to release pain medication over time and can control pain for up to 72 hours.  Depending on how you respond to EXPAREL, you may require less pain medication during your recovery.  Possible side effects:  Temporary loss of sensation or ability to move in the area where bupivacaine was injected.  Nausea, vomiting, constipation  Rarely, numbness and tingling in your mouth or lips, lightheadedness, or anxiety may occur.  Call your doctor right away if you think you may be experiencing any of these sensations, or if you have other questions regarding possible side effects.  Follow all other discharge instructions given to you by your surgeon or nurse. Eat a healthy diet and drink plenty of water or other fluids.  If you return to the hospital for any reason within 96 hours following the administration of EXPAREL, please inform your health care providers.

## 2017-04-18 NOTE — Op Note (Signed)
OPERATIVE REPORT  04/18/2017  [facility]  PREOPERATIVE DIAGNOSIS:  Bilateral macromastia.  POSTOPERATIVE DIAGNOSIS:  Bilateral macromastia.  PROCEDURE:  Bilateral reduction mammoplasties.  ATTENDING SURGEON:  Youlanda Roys, MD  ANESTHESIA:  General.  ANESTHESIOLOGIST:  , MD  COMPLICATIONS:  None.  INDICATIONS FOR THE PROCEDURE:  The patient is a 46 y.o. female who has bilateral macromastia that is clinically symptomatic.  She presents to undergo bilateral reduction mammoplasties.  DESCRIPTION OF PROCEDURE:  The patient was marked in preop holding area in a pattern of Wise for the future bilateral reduction mammoplasties. She was then taken back to the OR, placed on the table in supine position.  After adequate general anesthesia was obtained, the patient's chest was prepped with Techni-Care and draped in sterile fashion.  The bases of the breasts have been infiltrated with 1% lidocaine with epinephrine.  After adequate hemostasis and anesthesia taken effect, the procedure was begun.  Both of the breast reductions were performed in the following similar manner.  The nipple-areolar complex was marked with a 45-mm nipple marker.  The skin was then incised and deepithelialized around the nipple-areolar complex down to the inframammary crease in the inferior pedicle pattern.  Next, the medial, superior, and lateral skin flaps were elevated down to the chest wall.  Excess fat and glandular tissue removed from the inferior pedicle.  The nipple-areolar complex was examined and found to be pink and viable.  The wound was irrigated with saline irrigation.  Meticulous hemostasis was obtained with the Bovie electrocautery.  Inferior pedicle was centralized using 3-0 Prolene suture.  A #10 JP flat fully fluted drain was placed into the wound. The skin flaps were brought together at the inverted T junction with a 2- 0 Prolene suture.  The incisions were stapled for  temporary closure. The breasts compared and found to have good shape and symmetry.  The incisions were then closed from the medial aspect of the JP drain to the medial aspect of the [facility] incision by first placing a few 3-0 Monocryl sutures to tack together the dermal layer, and then both the dermal and cuticular layer were closed in a single layer using a 2-0 Quill PDO barbed suture.  Lateral to the JP drain incision was closed using 3-0 Monocryl in the dermal layer, followed by 3-0 Monocryl running intracuticular stitch on the skin.  The vertical limb of the Wise pattern was closed in the dermal layer using 3-0 Monocryl suture.  The patient was placed in the upright position.  The future location of the nipple-areolar complexes was marked on both breast mounds using the 45-mm nipple marker.  She was then placed back in the recumbent position.  Both of the nipple areolar complexes were brought out onto the breast mounds in the following similar manner.  The skin was incised as marked and removed in full thickness into the subcutaneous tissues.  The nipple- areolar complex was examined, found to be pink and viable, then brought out through this aperture and sewn in place using 4-0 Monocryl in the dermal layer, followed by 5-0 Monocryl running intracuticular stitch on the skin.  This 5-0 Monocryl suture was then brought down to close the cuticular layer of the vertical limb as well.  The JP drain was sewn in place using 3-0 nylon suture.  The pectoralis major muscle and fascia along with the breast and chest soft tissues were then infiltrated with 1% Exparel (total 266 mg).  Now the [facility] incision was also infiltrated  with the Exparel in order to give the patient postoperative pain control.  The incisions were dressed with benzoin, Steri-Strips, and the nipples dressed with bacitracin ointment and Adaptic.  4x4s were placed over the incisions and ABD pads in the axillary areas.  The patient  was placed into a light postoperative support bra.  There were no complications. The patient tolerated the procedure well.  The final needle, sponge counts were reported to be correct at the end of the case.  The patient was then recovered without complications.  Both the patient and her family were given proper postoperative wound care instructions. She was then discharged home in the care of her family in stable condition.  Follow up will be with me in a few days in the office.         Youlanda Roys, M.D.  04/18/2017 3:25 PM

## 2017-04-21 ENCOUNTER — Encounter (HOSPITAL_BASED_OUTPATIENT_CLINIC_OR_DEPARTMENT_OTHER): Payer: Self-pay | Admitting: Plastic Surgery

## 2017-04-21 NOTE — Anesthesia Postprocedure Evaluation (Signed)
Anesthesia Post Note  Patient: Claire Woods  Procedure(s) Performed: MAMMARY REDUCTION  (BREAST) BILATERAL (Bilateral )     Anesthesia Post Evaluation  Last Vitals:  Vitals:   04/18/17 1630 04/18/17 1645  BP: (!) 158/90 140/90  Pulse: (!) 114   Resp: 17 18  Temp:  37.2 C  SpO2: 95% 95%    Last Pain:  Vitals:   04/18/17 1645  TempSrc:   PainSc: 3    Pain Goal: Patients Stated Pain Goal: 1 (04/18/17 1645)               Lynda Rainwater

## 2018-11-10 ENCOUNTER — Other Ambulatory Visit: Payer: Self-pay | Admitting: Plastic Surgery

## 2018-11-10 ENCOUNTER — Other Ambulatory Visit (HOSPITAL_COMMUNITY): Payer: Self-pay | Admitting: Plastic Surgery

## 2018-11-10 DIAGNOSIS — K760 Fatty (change of) liver, not elsewhere classified: Secondary | ICD-10-CM

## 2018-11-11 ENCOUNTER — Other Ambulatory Visit (HOSPITAL_COMMUNITY): Payer: Self-pay | Admitting: Plastic Surgery

## 2018-11-11 DIAGNOSIS — T148XXA Other injury of unspecified body region, initial encounter: Secondary | ICD-10-CM

## 2018-11-11 DIAGNOSIS — I96 Gangrene, not elsewhere classified: Secondary | ICD-10-CM

## 2018-11-11 DIAGNOSIS — L24A9 Irritant contact dermatitis due friction or contact with other specified body fluids: Secondary | ICD-10-CM

## 2018-11-12 ENCOUNTER — Other Ambulatory Visit: Payer: Self-pay

## 2018-11-12 ENCOUNTER — Ambulatory Visit (HOSPITAL_COMMUNITY)
Admission: RE | Admit: 2018-11-12 | Discharge: 2018-11-12 | Disposition: A | Discharge status: Home / Self Care | Payer: BC Managed Care – PPO | Source: Ambulatory Visit | Attending: Plastic Surgery | Admitting: Plastic Surgery

## 2018-11-12 ENCOUNTER — Other Ambulatory Visit (HOSPITAL_COMMUNITY): Payer: Self-pay | Admitting: Plastic Surgery

## 2018-11-12 DIAGNOSIS — I96 Gangrene, not elsewhere classified: Secondary | ICD-10-CM | POA: Diagnosis present

## 2018-11-12 DIAGNOSIS — T148XXA Other injury of unspecified body region, initial encounter: Secondary | ICD-10-CM

## 2018-11-12 DIAGNOSIS — I9789 Other postprocedural complications and disorders of the circulatory system, not elsewhere classified: Secondary | ICD-10-CM | POA: Diagnosis present

## 2018-11-12 DIAGNOSIS — L24A9 Irritant contact dermatitis due friction or contact with other specified body fluids: Secondary | ICD-10-CM

## 2019-12-09 ENCOUNTER — Other Ambulatory Visit: Payer: Self-pay | Admitting: Nurse Practitioner

## 2019-12-09 DIAGNOSIS — Z1231 Encounter for screening mammogram for malignant neoplasm of breast: Secondary | ICD-10-CM

## 2020-03-08 ENCOUNTER — Ambulatory Visit: Payer: BC Managed Care – PPO

## 2020-04-20 ENCOUNTER — Ambulatory Visit
Admission: RE | Admit: 2020-04-20 | Discharge: 2020-04-20 | Disposition: A | Discharge status: Home / Self Care | Payer: BC Managed Care – PPO | Source: Ambulatory Visit | Attending: Nurse Practitioner | Admitting: Nurse Practitioner

## 2020-04-20 ENCOUNTER — Other Ambulatory Visit: Payer: Self-pay

## 2020-04-20 DIAGNOSIS — Z1231 Encounter for screening mammogram for malignant neoplasm of breast: Secondary | ICD-10-CM

## 2020-04-25 ENCOUNTER — Other Ambulatory Visit: Payer: Self-pay | Admitting: Nurse Practitioner

## 2020-04-25 DIAGNOSIS — R928 Other abnormal and inconclusive findings on diagnostic imaging of breast: Secondary | ICD-10-CM

## 2020-05-01 ENCOUNTER — Other Ambulatory Visit: Payer: BC Managed Care – PPO

## 2020-05-19 ENCOUNTER — Ambulatory Visit: Payer: Self-pay

## 2020-05-19 ENCOUNTER — Other Ambulatory Visit: Payer: Self-pay

## 2020-05-19 ENCOUNTER — Other Ambulatory Visit: Payer: Self-pay | Admitting: Nurse Practitioner

## 2020-05-19 ENCOUNTER — Ambulatory Visit
Admission: RE | Admit: 2020-05-19 | Discharge: 2020-05-19 | Disposition: A | Discharge status: Home / Self Care | Payer: Self-pay | Source: Ambulatory Visit | Attending: Nurse Practitioner | Admitting: Nurse Practitioner

## 2020-05-19 ENCOUNTER — Ambulatory Visit
Admission: RE | Admit: 2020-05-19 | Discharge: 2020-05-19 | Disposition: A | Discharge status: Home / Self Care | Payer: BC Managed Care – PPO | Source: Ambulatory Visit | Attending: Nurse Practitioner | Admitting: Nurse Practitioner

## 2020-05-19 DIAGNOSIS — R928 Other abnormal and inconclusive findings on diagnostic imaging of breast: Secondary | ICD-10-CM

## 2020-05-30 ENCOUNTER — Ambulatory Visit
Admission: RE | Admit: 2020-05-30 | Discharge: 2020-05-30 | Disposition: A | Discharge status: Home / Self Care | Payer: BC Managed Care – PPO | Source: Ambulatory Visit | Attending: Nurse Practitioner | Admitting: Nurse Practitioner

## 2020-05-30 ENCOUNTER — Other Ambulatory Visit: Payer: Self-pay | Admitting: Nurse Practitioner

## 2020-05-30 ENCOUNTER — Other Ambulatory Visit: Payer: Self-pay

## 2020-05-30 ENCOUNTER — Other Ambulatory Visit (HOSPITAL_COMMUNITY)
Admission: RE | Admit: 2020-05-30 | Discharge: 2020-05-30 | Disposition: A | Discharge status: Home / Self Care | Payer: BC Managed Care – PPO | Source: Ambulatory Visit | Attending: Radiology | Admitting: Radiology

## 2020-05-30 ENCOUNTER — Other Ambulatory Visit: Payer: Self-pay | Admitting: Radiology

## 2020-05-30 DIAGNOSIS — R928 Other abnormal and inconclusive findings on diagnostic imaging of breast: Secondary | ICD-10-CM

## 2020-05-31 LAB — SURGICAL PATHOLOGY

## 2022-04-13 IMAGING — MG MM DIGITAL DIAGNOSTIC UNILAT*L* W/ TOMO W/ CAD
6 series · 6 of 18 positions shown · non-contrast
Comparison: Previous exam(s).

ACR Breast Density Category a: The breast tissue is almost entirely
fatty.

CLINICAL DATA: 49-year-old female presenting as a recall from
screening for possible left breast asymmetry and possible abnormal
right axillary lymph node. History of bilateral breast reduction.

EXAM:
DIGITAL DIAGNOSTIC LEFT MAMMOGRAM
ULTRASOUND RIGHT AXILLARY
TECHNIQUE: Left digital diagnostic mammography was performed. Targeted
ultrasound examination of the Right axilla was performed.

[L CC synth-2D]
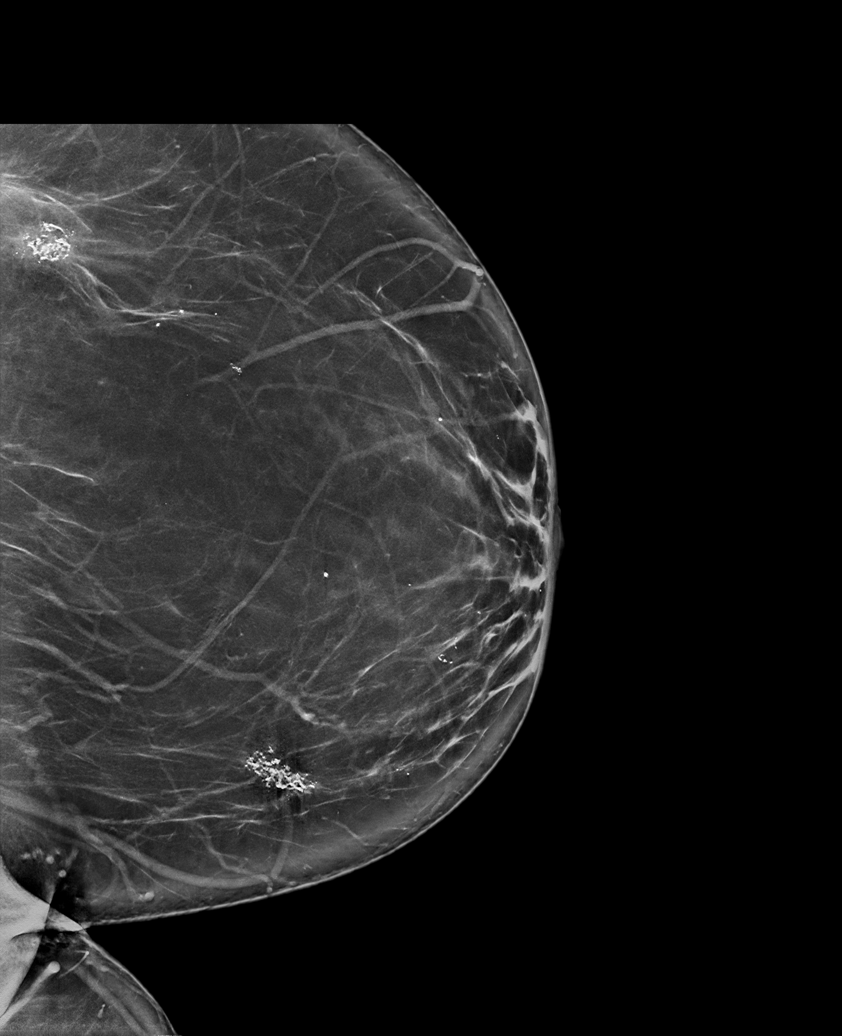

[L MLO synth-2D]
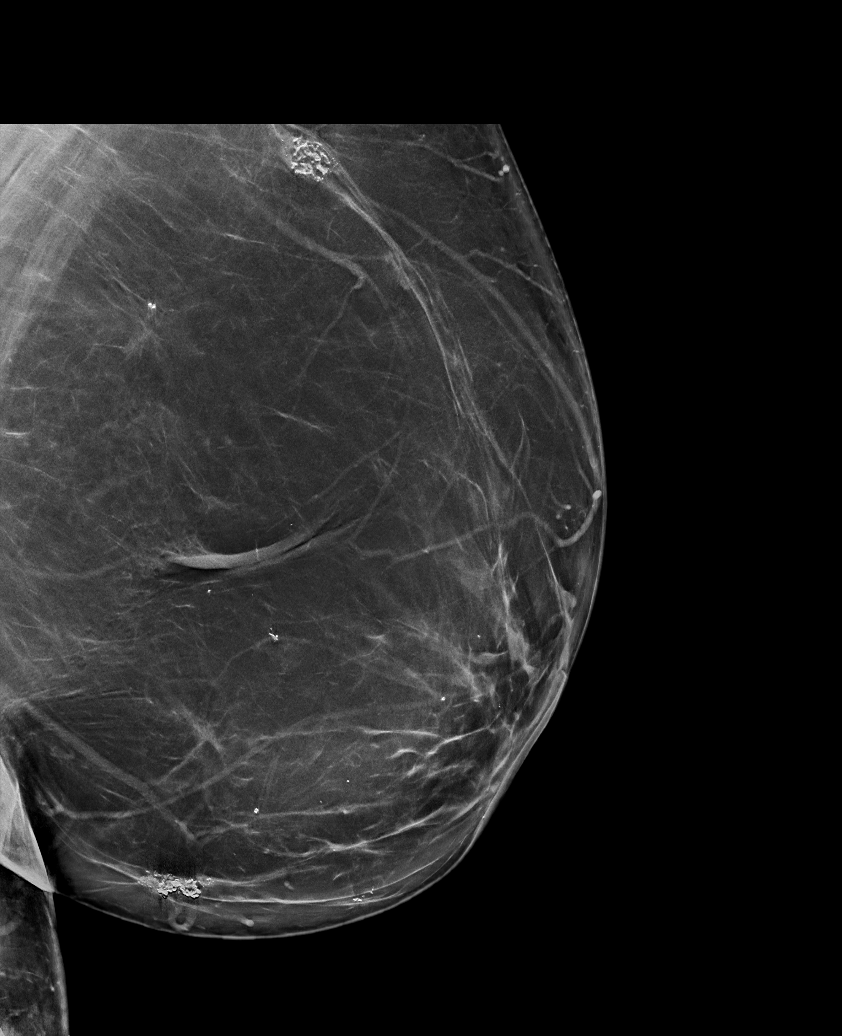

[L XCCL synth-2D]
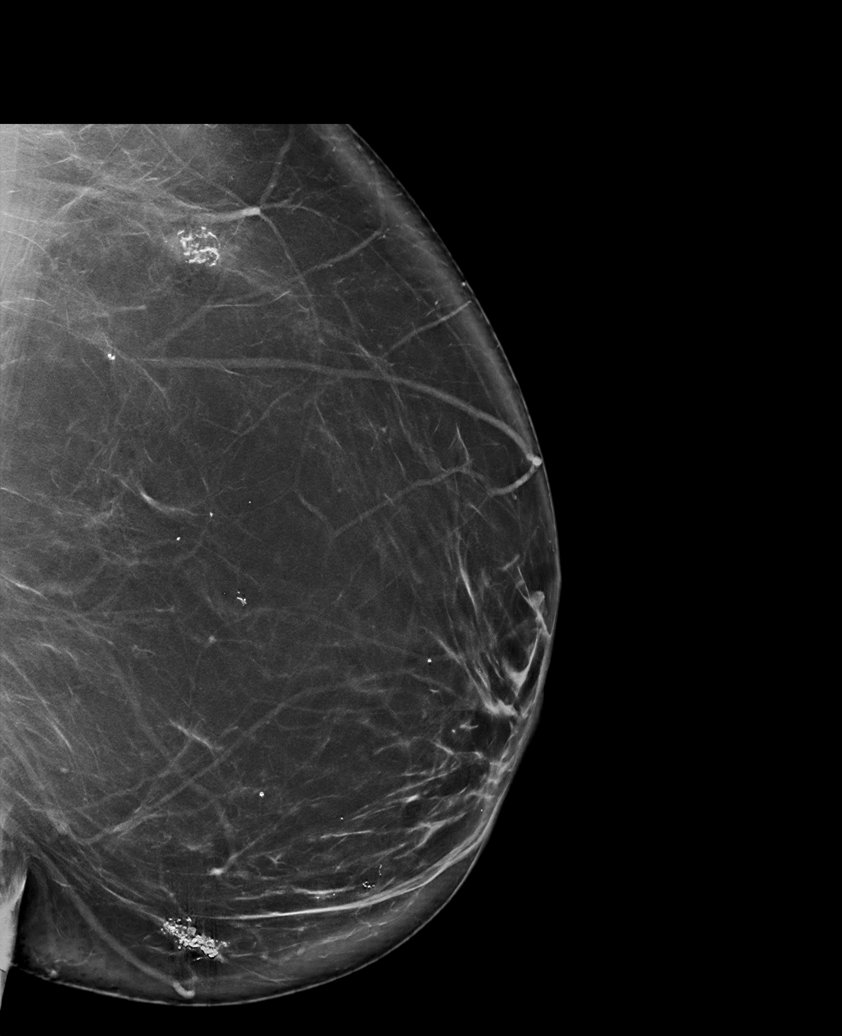

[L XCCL tomo · tomo slice 51/102.0]
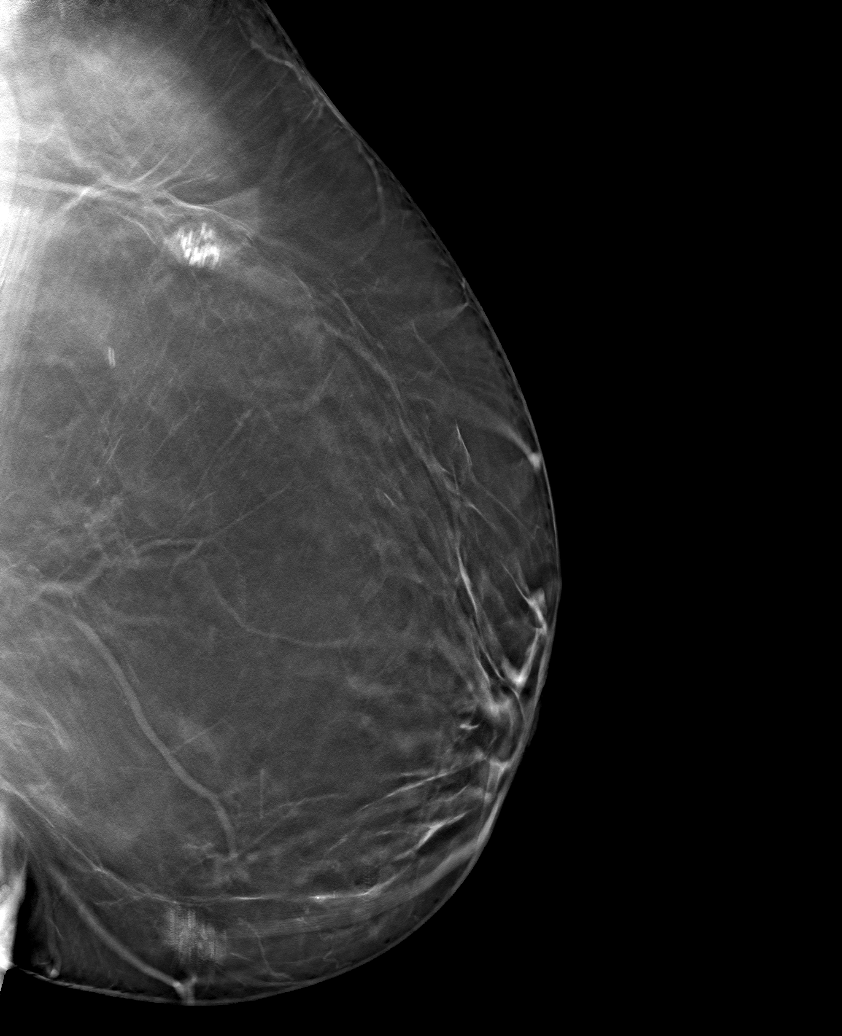

[L CC tomo · tomo slice 45/89.0]
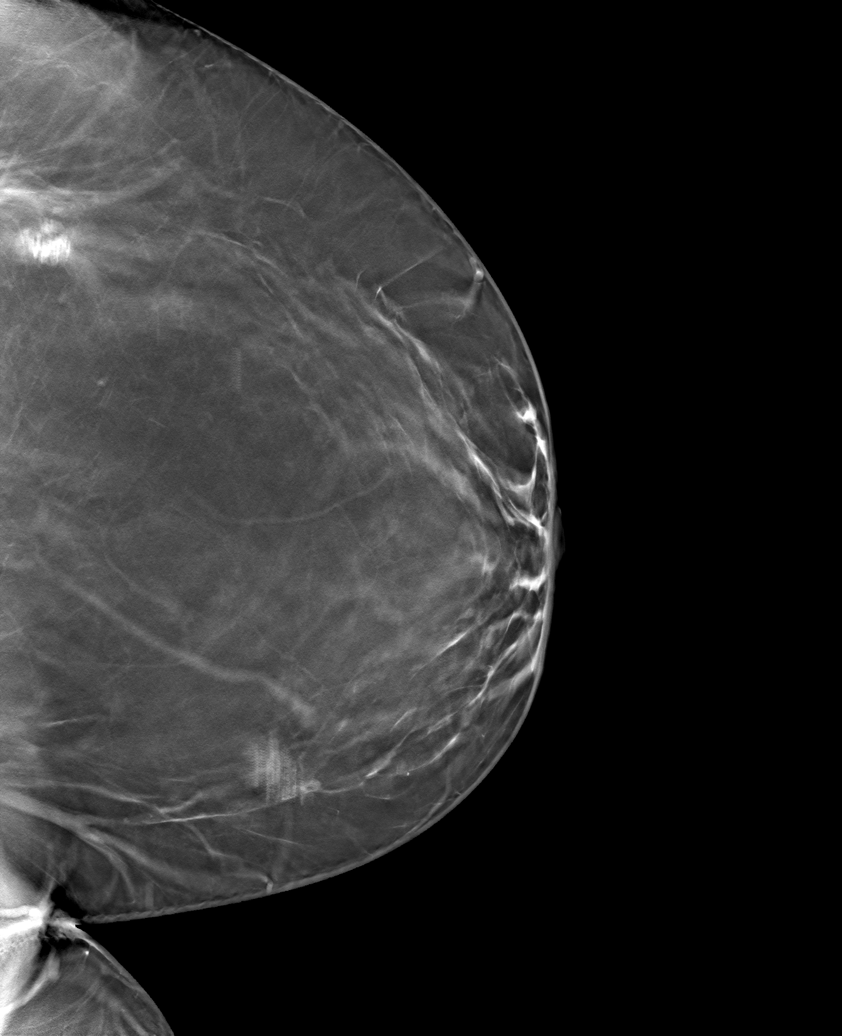

[L MLO tomo · tomo slice 49/97.0]
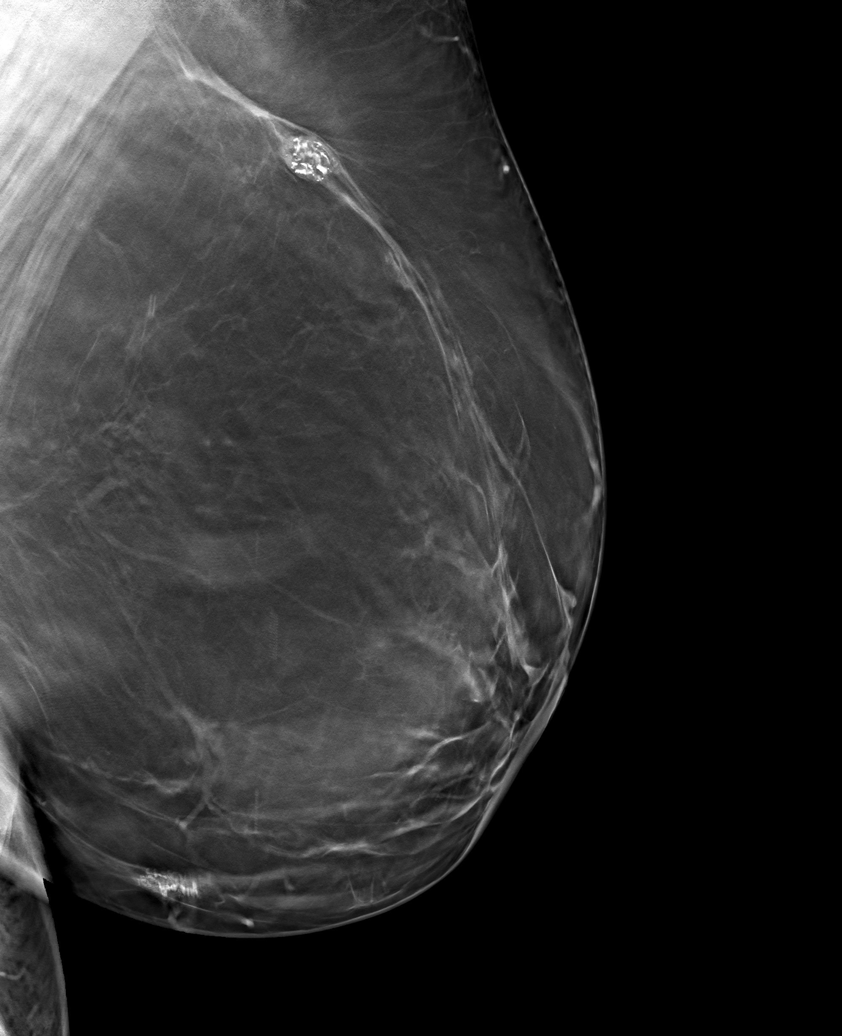

[6 of 18 positions shown; findings below may reference images not displayed]

FINDINGS: Mammogram:

Full field and spot compression tomosynthesis views of the left
breast performed for a questioned asymmetry seen only on the cc view
in the outer posterior left breast. On the additional imaging the
tissue in this area disperses without persistent asymmetry, mass or
distortion.

Ultrasound:

Targeted ultrasound is performed in the right axilla demonstrating
two lymph nodes with abnormal cortical thickening measuring up to
0.8 cm. This corresponds to the abnormality identified on screening
mammogram in the right axilla.
IMPRESSION: 1.  Resolution of the questioned asymmetry in the outer left breast.

2. Indeterminate right axillary lymph nodes with cortical
thickening.

RECOMMENDATION:
Ultrasound-guided core needle biopsy of a right axillary lymph node.

I have discussed the findings and recommendations with the patient
who agrees to proceed with biopsy. The patient will be scheduled for
biopsy prior to leaving our office today.

BI-RADS CATEGORY  4: Suspicious.

## 2022-07-12 ENCOUNTER — Other Ambulatory Visit (HOSPITAL_COMMUNITY): Payer: Self-pay

## 2022-07-12 MED ORDER — MOUNJARO 10 MG/0.5ML ~~LOC~~ SOAJ
10.0000 mg | SUBCUTANEOUS | 1 refills | Status: DC
  Filled 2022-07-12 – 2022-07-16 (×2): qty 2, 28d supply, fill #0

## 2022-07-16 ENCOUNTER — Other Ambulatory Visit (HOSPITAL_BASED_OUTPATIENT_CLINIC_OR_DEPARTMENT_OTHER): Payer: Self-pay

## 2022-07-16 ENCOUNTER — Other Ambulatory Visit (HOSPITAL_COMMUNITY): Payer: Self-pay

## 2022-08-02 ENCOUNTER — Other Ambulatory Visit (HOSPITAL_BASED_OUTPATIENT_CLINIC_OR_DEPARTMENT_OTHER): Payer: Self-pay

## 2022-08-02 MED ORDER — MOUNJARO 12.5 MG/0.5ML ~~LOC~~ SOAJ
12.5000 mg | SUBCUTANEOUS | 3 refills | Status: AC
  Filled 2022-08-02: qty 2, 28d supply, fill #0

## 2022-11-05 ENCOUNTER — Other Ambulatory Visit: Payer: Self-pay | Admitting: Nurse Practitioner

## 2022-11-05 DIAGNOSIS — Z1231 Encounter for screening mammogram for malignant neoplasm of breast: Secondary | ICD-10-CM

## 2022-11-06 ENCOUNTER — Ambulatory Visit: Admission: RE | Admit: 2022-11-06 | Payer: BC Managed Care – PPO | Source: Ambulatory Visit

## 2022-11-06 DIAGNOSIS — Z1231 Encounter for screening mammogram for malignant neoplasm of breast: Secondary | ICD-10-CM

## 2023-07-28 ENCOUNTER — Telehealth: Payer: Self-pay | Admitting: Emergency Medicine

## 2023-07-28 ENCOUNTER — Ambulatory Visit: Admission: EM | Admit: 2023-07-28 | Discharge: 2023-07-28 | Disposition: A | Discharge status: Home / Self Care

## 2023-07-28 ENCOUNTER — Encounter: Payer: Self-pay | Admitting: Emergency Medicine

## 2023-07-28 DIAGNOSIS — L290 Pruritus ani: Secondary | ICD-10-CM | POA: Diagnosis not present

## 2023-07-28 DIAGNOSIS — L309 Dermatitis, unspecified: Secondary | ICD-10-CM

## 2023-07-28 DIAGNOSIS — L259 Unspecified contact dermatitis, unspecified cause: Secondary | ICD-10-CM | POA: Diagnosis not present

## 2023-07-28 DIAGNOSIS — L299 Pruritus, unspecified: Secondary | ICD-10-CM | POA: Diagnosis not present

## 2023-07-28 DIAGNOSIS — K649 Unspecified hemorrhoids: Secondary | ICD-10-CM

## 2023-07-28 DIAGNOSIS — E119 Type 2 diabetes mellitus without complications: Secondary | ICD-10-CM

## 2023-07-28 MED ORDER — DEXAMETHASONE SODIUM PHOSPHATE 10 MG/ML IJ SOLN
10.0000 mg | Freq: Once | INTRAMUSCULAR | Status: AC
  Administered 2023-07-28: 10 mg via INTRAMUSCULAR

## 2023-07-28 MED ORDER — HYDROCORTISONE ACETATE 25 MG RE SUPP: 25.0000 mg | Freq: Two times a day (BID) | RECTAL | 0 refills | Status: DC

## 2023-07-28 MED ORDER — HYDROCORTISONE (PERIANAL) 2.5 % EX CREA: TOPICAL_CREAM | Freq: Two times a day (BID) | CUTANEOUS | Status: DC

## 2023-07-28 MED ORDER — HYDROCORTISONE (PERIANAL) 2.5 % EX CREA: 1.0000 | TOPICAL_CREAM | Freq: Two times a day (BID) | CUTANEOUS | 0 refills | Status: AC

## 2023-07-28 MED ORDER — CETIRIZINE HCL 10 MG PO TABS: 10.0000 mg | ORAL_TABLET | Freq: Every morning | ORAL | 0 refills | Status: AC

## 2023-07-28 MED ORDER — HYDROXYZINE HCL 25 MG PO TABS: 50.0000 mg | ORAL_TABLET | Freq: Every day | ORAL | 0 refills | Status: AC

## 2023-07-28 MED ORDER — TRIAMCINOLONE ACETONIDE 0.5 % EX OINT: 1.0000 | TOPICAL_OINTMENT | Freq: Two times a day (BID) | CUTANEOUS | 0 refills | Status: AC

## 2023-07-28 NOTE — ED Provider Notes (Signed)
 [facility]    CSN: [id number] Arrival date & time: 07/28/23  1549      History   Chief Complaint Chief Complaint  Patient presents with   Rash    HPI Claire Woods is a 53 y.o. female.   Subjective:   Claire Woods is a 53 year old female who presents with a progressively worsening rash. Symptoms began approximately four days ago with gradual onset under the left breast and have since spread to involve the entire chest, back, buttocks, abdomen, and bilateral inner thighs. The rash is intensely pruritic, and the patient reports scratching to the point of skin breakdown in some areas. She has been taking Zyrtec for the past few days without meaningful relief. She denies any recent illness, fever, body aches, new exposures, or travel. No known contacts with similar symptoms. Notably, the patient has been under significant emotional stress due to the recent end of a long-term relationship.   She also reports anal itching, which she attributes to a known history of hemorrhoids. She denies any bleeding, constipation, or changes in bowel habits. While she has used creams in the past for hemorrhoid symptoms, she currently prefers to avoid them due to messiness. She attempted to contact her primary care provider, who is currently out of the country. She is unsure of their return date but plans to follow up for her annual physical when available.  The following portions of the patient's history were reviewed and updated as appropriate: allergies, current medications, past family history, past medical history, past social history, past surgical history, and problem list.      Past Medical History:  Diagnosis Date   Anemia    Antiphospholipid antibody positive    Anxiety    Arthritis    Headache    Migraines   Hypertension    Knee pain, bilateral    has had steroid injections in the past    Patient Active Problem List   Diagnosis Date Noted   S/P TAH (total  abdominal hysterectomy) 07/31/2016   Antiphospholipid antibody positive 01/23/2016    Past Surgical History:  Procedure Laterality Date   BREAST REDUCTION SURGERY Bilateral 04/18/2017   Procedure: MAMMARY REDUCTION  (BREAST) BILATERAL;  Surgeon: Eloise Levels, MD;  Location: [facility];  Service: Plastics;  Laterality: Bilateral;   HYSTERECTOMY ABDOMINAL WITH SALPINGECTOMY Bilateral 07/31/2016   Procedure: HYSTERECTOMY ABDOMINAL WITH SALPINGECTOMY;  Surgeon: Gerald Leitz, MD;  Location: [facility];  Service: Gynecology;  Laterality: Bilateral;   LYSIS OF ADHESION  07/31/2016   Procedure: EXTENSIVE LYSIS OF ADHESIONS;  Surgeon: Gerald Leitz, MD;  Location: [facility];  Service: Gynecology;;   MYOMECTOMY     REDUCTION MAMMAPLASTY     TUMOR REMOVAL Left    behind ear   WISDOM TOOTH EXTRACTION      OB History   No obstetric history on file.      Home Medications    Prior to Admission medications   Medication Sig Start Date End Date Taking? Authorizing Provider  cetirizine (ZYRTEC) 10 MG tablet Take 1 tablet (10 mg total) by mouth in the morning. 07/28/23  Yes Lurline Idol, FNP  Continuous Glucose Sensor (FREESTYLE LIBRE 3 SENSOR) MISC USE AS DIRECTED EVERY TWO WEEKS 05/23/23  Yes [provider]  hydrOXYzine (ATARAX) 25 MG tablet Take 2 tablets (50 mg total) by mouth at bedtime. 07/28/23  Yes Lurline Idol, FNP  triamcinolone ointment (KENALOG) 0.5 % Apply 1 Application topically 2 (two) times daily. Apply  to affected areas twice a day until symptoms resolve up to 2 weeks 07/28/23  Yes Lurline Idol, FNP  amLODipine (NORVASC) 10 MG tablet Take 10 mg by mouth daily.    [provider]  hydrocortisone (ANUSOL-HC) 25 MG suppository Place 1 suppository (25 mg total) rectally 2 (two) times daily. 07/28/23  Yes Aydien Majette, Lelon Mast, FNP  JARDIANCE 10 MG TABS tablet Take 10 mg by mouth daily.    [provider]  lisinopril (ZESTRIL) 10 MG tablet      [provider]  meloxicam (MOBIC) 15 MG tablet Take 15 mg by mouth daily.    [provider]  rosuvastatin (CRESTOR) 10 MG tablet     [provider]  tirzepatide (MOUNJARO) 12.5 MG/0.5ML Pen Inject 12.5 mg into the skin once a week at noon. 08/02/22       Family History Family History  Problem Relation Age of Onset   Breast cancer Neg Hx     Social History Social History   Tobacco Use   Smoking status: Never   Smokeless tobacco: Never  Vaping Use   Vaping status: Never Used  Substance Use Topics   Alcohol use: Yes    Comment: Occasionally.   Drug use: No     Allergies   Patient has no known allergies.   Review of Systems Review of Systems  Constitutional:  Negative for fever.  Gastrointestinal:  Negative for abdominal pain, anal bleeding, blood in stool and constipation.       Anal itching  Skin:  Positive for rash.  All other systems reviewed and are negative.    Physical Exam Triage Vital Signs ED Triage Vitals  Encounter Vitals Group     BP 07/28/23 1555 132/83     Systolic BP Percentile --      Diastolic BP Percentile --      Pulse Rate 07/28/23 1555 97     Resp 07/28/23 1555 17     Temp 07/28/23 1555 98.2 F (36.8 C)     Temp Source 07/28/23 1555 Oral     SpO2 07/28/23 1555 97 %     Weight --      Height --      Head Circumference --      Peak Flow --      Pain Score 07/28/23 1600 6     Pain Loc --      Pain Education --      Exclude from Growth Chart --    No data found.  Updated Vital Signs BP 132/83 (BP Location: Right Arm)   Pulse 97   Temp 98.2 F (36.8 C) (Oral)   Resp 17   LMP 07/26/2016 (Exact Date)   SpO2 97%   Visual Acuity Right Eye Distance:   Left Eye Distance:   Bilateral Distance:    Right Eye Near:   Left Eye Near:    Bilateral Near:     Physical Exam Vitals reviewed.  Constitutional:      General: She is not in acute distress.    Appearance: Normal appearance. She is not  ill-appearing or toxic-appearing.  HENT:     Head: Normocephalic.  Cardiovascular:     Rate and Rhythm: Normal rate.  Pulmonary:     Effort: Pulmonary effort is normal.  Abdominal:     Palpations: Abdomen is soft.  Genitourinary:    Comments: Rectal exam deferred  Musculoskeletal:        General: Normal range of motion.  Cervical back: Normal range of motion and neck supple.  Skin:    General: Skin is warm and dry.     Findings: Rash present.     Comments: Diffuse hyperpigmented macular rash noted across the chest, back, abdomen, buttocks, and bilateral inner thighs, with multiple areas of excoriation consistent with scratching. No evidence of active infection, weeping, or open lesions. Skin otherwise warm and intact.  Neurological:     General: No focal deficit present.     Mental Status: She is alert and oriented to person, place, and time.      UC Treatments / Results  Labs (all labs ordered are listed, but only abnormal results are displayed) Labs Reviewed - No data to display  EKG   Radiology No results found.  Procedures Procedures (including critical care time)  Medications Ordered in UC Medications  dexamethasone (DECADRON) injection 10 mg (10 mg Intramuscular Given 07/28/23 1802)    Initial Impression / Assessment and Plan / UC Course  I have reviewed the triage vital signs and the nursing notes.  Pertinent labs & imaging results that were available during my care of the patient were reviewed by me and considered in my medical decision making (see chart for details).    53 year old female presenting with a diffuse, pruritic rash without any fevers or systemic symptoms. There are no new exposures, recent travel, or known contacts with similar symptoms. She has been under significant emotional stress recently, which may be contributing to her symptoms. Her medical history includes well-controlled type 2 diabetes managed with Jardiance and Mounjaro. She uses a  CGM and reports her last A1c was 5.9%. A 10 mg IM dose of Decadron was administered in the clinic to reduce inflammation. Oral steroids were avoided due to her diabetes. She was advised to continue cetirizine in the morning and to start hydroxyzine 50 mg nightly, along with triamcinolone ointment applied twice daily for up to two weeks. Supportive care measures were reviewed, including close blood glucose monitoring. Additionally, she reports recent anal itching without bleeding, constipation, or bowel changes. She has a known history of hemorrhoids and prefers treatment other than topical creams. Anusol-HC suppositories were provided. She will follow up with her PCP when available.  Today's evaluation has revealed no signs of a dangerous process. Discussed diagnosis with patient and/or guardian. Patient and/or guardian aware of their diagnosis, possible red flag symptoms to watch out for and need for close follow up. Patient and/or guardian understands verbal and written discharge instructions. Patient and/or guardian comfortable with plan and disposition.  Patient and/or guardian has a clear mental status at this time, good insight into illness (after discussion and teaching) and has clear judgment to make decisions regarding their care  Documentation was completed with the aid of voice recognition software. Transcription may contain typographical errors. Final Clinical Impressions(s) / UC Diagnoses   Final diagnoses:  Pruritus  Controlled type 2 diabetes mellitus without complication, without long-term current use of insulin (HCC)  Anal itching  Hemorrhoids, unspecified hemorrhoid type  Dermatitis     Discharge Instructions      Your rash may be related to stress. When you're under stress, your body releases hormones that can affect your skin by increasing blood flow, lowering your immune response, and weakening your skin's natural barrier. This can lead to redness, swelling, irritation, and  make your skin more sensitive to reactions.  You received a steroid injection today to help reduce the inflammation and itching. Steroids can cause blood sugar  levels to rise, so continue taking your usual medications and monitor your blood sugar closely. You will also take cetirizine each morning and hydroxyzine at night to help with itching. Use the prescribed steroid ointment on the rash twice a day for up to two weeks. To help soothe your skin, you can apply cool, wet cloths to the irritated areas. Try not to scratch, as this can make the rash worse or cause an infection. Avoid rubbing or using anything that might irritate the skin further. Stay away from soaps, perfumes, dyes, or scented products. Use only gentle, fragrance-free items and bathe in lukewarm water, not hot.  You also mentioned itching in the anal area, likely from hemorrhoids. I prescribed suppositories you can use twice a day to help relieve this discomfort. Eat a diet high in fiber and drink plenty of water. Try to avoid straining or pushing during bowel movements.       ED Prescriptions     Medication Sig Dispense Auth. Provider   hydrOXYzine (ATARAX) 25 MG tablet Take 2 tablets (50 mg total) by mouth at bedtime. 10 tablet Lurline Idol, FNP   cetirizine (ZYRTEC) 10 MG tablet Take 1 tablet (10 mg total) by mouth in the morning. 14 tablet Lurline Idol, FNP   triamcinolone ointment (KENALOG) 0.5 % Apply 1 Application topically 2 (two) times daily. Apply to affected areas twice a day until symptoms resolve up to 2 weeks 45 g Lurline Idol, FNP   hydrocortisone (ANUSOL-HC) 25 MG suppository Place 1 suppository (25 mg total) rectally 2 (two) times daily. 12 suppository Lurline Idol, FNP      PDMP not reviewed this encounter.   Lurline Idol, [place] 07/28/23 1910

## 2023-07-28 NOTE — Discharge Instructions (Addendum)
 Your rash may be related to stress. When you're under stress, your body releases hormones that can affect your skin by increasing blood flow, lowering your immune response, and weakening your skin's natural barrier. This can lead to redness, swelling, irritation, and make your skin more sensitive to reactions.  You received a steroid injection today to help reduce the inflammation and itching. Steroids can cause blood sugar levels to rise, so continue taking your usual medications and monitor your blood sugar closely. You will also take cetirizine each morning and hydroxyzine at night to help with itching. Use the prescribed steroid ointment on the rash twice a day for up to two weeks. To help soothe your skin, you can apply cool, wet cloths to the irritated areas. Try not to scratch, as this can make the rash worse or cause an infection. Avoid rubbing or using anything that might irritate the skin further. Stay away from soaps, perfumes, dyes, or scented products. Use only gentle, fragrance-free items and bathe in lukewarm water, not hot.  You also mentioned itching in the anal area, likely from hemorrhoids. I prescribed suppositories you can use twice a day to help relieve this discomfort. Eat a diet high in fiber and drink plenty of water. Try to avoid straining or pushing during bowel movements.

## 2023-07-28 NOTE — ED Triage Notes (Signed)
 Pt presents with rash that began on chest but is now on arms, and back. Rash is painful and itchy.

## 2023-07-28 NOTE — Telephone Encounter (Signed)
 Pt returned to [facility] stating insurance would not cover suppository hydrocortisone. Medication was changed to cream and pt informed.

## 2024-05-26 ENCOUNTER — Encounter: Payer: Self-pay | Admitting: Gastroenterology
